# Patient Record
Sex: Male | Born: 1975 | Race: White | Hispanic: No | Marital: Married | Smoking: Never smoker
Health system: Southern US, Community
[De-identification: ages and names within clinical notes are randomized; demographics above are authoritative.]

## PROBLEM LIST (undated history)

## (undated) DIAGNOSIS — E7800 Pure hypercholesterolemia, unspecified: Secondary | ICD-10-CM

## (undated) HISTORY — PX: EAR MEATOPLASTY WITH FULL THICKNESS SKIN GRAFT: SHX6486

---

## 2004-11-23 HISTORY — PX: TYMPANOPLASTY: SHX33

## 2014-11-23 HISTORY — PX: VASECTOMY: SHX75

## 2015-08-01 ENCOUNTER — Telehealth: Payer: Self-pay | Admitting: Adult Health

## 2015-08-01 NOTE — Telephone Encounter (Signed)
Pt's wife, Brendan Gadson,  has est w/ you, and pt is her husband.  He would like to est w/ you as well, however pt has a poss sinus inf and would like to be seen asap.   Typically we can set a new acute appt, and at the same time schedule his new pt appt at a later date. But I wanted to check w/ you to make sure that was ok.  pls advise if ok to schedule, or not. Thank you.

## 2015-08-01 NOTE — Telephone Encounter (Signed)
2 apps for pt has been scheduled.

## 2015-08-01 NOTE — Telephone Encounter (Signed)
That is ok with me.  Thanks,   [employer]

## 2015-08-02 ENCOUNTER — Ambulatory Visit (INDEPENDENT_AMBULATORY_CARE_PROVIDER_SITE_OTHER): Payer: BLUE CROSS/BLUE SHIELD | Admitting: Adult Health

## 2015-08-02 ENCOUNTER — Encounter: Payer: Self-pay | Admitting: Adult Health

## 2015-08-02 VITALS — BP 90/68 | HR 77 | Temp 98.2°F | Ht 69.5 in | Wt 194.3 lb

## 2015-08-02 DIAGNOSIS — J028 Acute pharyngitis due to other specified organisms: Secondary | ICD-10-CM

## 2015-08-02 DIAGNOSIS — J029 Acute pharyngitis, unspecified: Secondary | ICD-10-CM | POA: Diagnosis not present

## 2015-08-02 DIAGNOSIS — B9789 Other viral agents as the cause of diseases classified elsewhere: Secondary | ICD-10-CM

## 2015-08-02 DIAGNOSIS — J0110 Acute frontal sinusitis, unspecified: Secondary | ICD-10-CM | POA: Diagnosis not present

## 2015-08-02 LAB — POCT MONO (EPSTEIN BARR VIRUS): Mono, POC: NEGATIVE

## 2015-08-02 MED ORDER — MAGIC MOUTHWASH W/LIDOCAINE: 5.0000 mL | Freq: Three times a day (TID) | ORAL | Status: DC | PRN

## 2015-08-02 NOTE — Progress Notes (Signed)
Subjective:    Patient ID: Richard Mathews, male    DOB: 04-Jan-1976, 39 y.o.   MRN: [id number]  HPI  39 year old male who presents to the office today for sinusitis type symptoms. His symptoms have been going on for two days, and include sinus pain and pressure, headache, body aches, chills, and generalized fatigue with yellow nasal drainage.   Denies fevers, nausea, vomiting, or diarrhea.   Has been using OTC sinus medication, which helped with the symptoms.  Review of Systems  Constitutional: Positive for activity change and fatigue. Negative for fever, chills, diaphoresis and appetite change.  HENT: Positive for congestion, ear pain (pressure), postnasal drip, rhinorrhea, sinus pressure and sore throat. Negative for tinnitus and trouble swallowing.   Eyes: Negative.   Respiratory: Negative.   Cardiovascular: Negative.   Skin: Negative.   Neurological: Positive for headaches (frontal ). Negative for speech difficulty and light-headedness.  Hematological: Negative.   Psychiatric/Behavioral: Negative.   All other systems reviewed and are negative.  No past medical history on file.  Social History   Social History  . Marital Status: Unknown    Spouse Name: N/A  . Number of Children: N/A  . Years of Education: N/A   Occupational History  . Not on file.   Social History Main Topics  . Smoking status: Never Smoker   . Smokeless tobacco: Not on file  . Alcohol Use: 0.0 oz/week    0 Standard drinks or equivalent per week     Comment: socailly  . Drug Use: Not on file  . Sexual Activity: Not on file   Other Topics Concern  . Not on file   Social History Narrative  . No narrative on file    No past surgical history on file.  No family history on file.  No Known Allergies  No current outpatient prescriptions on file prior to visit.   No current facility-administered medications on file prior to visit.    BP 90/68 mmHg  Pulse 77  Temp(Src) 98.2 F (36.8 C)  (Oral)  Ht 5' 9.5" (1.765 m)  Wt 194 lb 4.8 oz (88.134 kg)  BMI 28.29 kg/m2  SpO2 97%       Objective:   Physical Exam  Constitutional: He is oriented to person, place, and time. He appears well-developed and well-nourished. No distress.  HENT:  Head: Normocephalic and atraumatic.  Right Ear: External ear normal.  Left Ear: External ear normal.  Nose: Nose normal.  Mouth/Throat: Oropharynx is clear and moist. No oropharyngeal exudate.  Sinus pain and pressure to frontal and maxillary sinus  Eyes: Conjunctivae and EOM are normal. Pupils are equal, round, and reactive to light. Right eye exhibits no discharge. Left eye exhibits no discharge. No scleral icterus.  No jaundice noted  Neck: No thyromegaly present.  Cardiovascular: Normal rate, regular rhythm, normal heart sounds and intact distal pulses.  Exam reveals no gallop.   No murmur heard. Pulmonary/Chest: Effort normal and breath sounds normal. No respiratory distress. He has no wheezes. He has no rales. He exhibits no tenderness.  Abdominal: Soft. Bowel sounds are normal. He exhibits no distension and no mass. There is tenderness (slight tenderness over liver with deep palpation). There is no rebound and no guarding.  Lymphadenopathy:    He has no cervical adenopathy.  Neurological: He is alert and oriented to person, place, and time.  Skin: Skin is warm and dry. No rash noted. He is not diaphoretic. No erythema. No pallor.  Psychiatric:  He has a normal mood and affect. His behavior is normal. Judgment and thought content normal.  Nursing note and vitals reviewed.     Assessment & Plan:  1. Acute frontal sinusitis, recurrence not specified - Likely viral  - POC Mono (Epstein Barr Virus)0- Negative - Flonase and Claritin for sinus pain and pressure - Rest and hydration - Ibuprofen for body aches - Follow up in 2-3 days if no improvement.  2. Sore throat (viral) - magic mouthwash w/lidocaine SOLN; Take 5 mLs by mouth 3  (three) times daily as needed for mouth pain.  Dispense: 50 mL; Refill: 0

## 2015-08-02 NOTE — Patient Instructions (Addendum)
It was great seeing you again!  It sounds like you have a viral sinusitis.   Try using claritin and flonase to help with the sinus pressure. Use the magic mouth wash for throat pain ( gargle and spit). Ibuprofen for body aches. Stay hydrated and get plenty of rest.   If your symptoms do not get any better in 2-3 days,please let me know.   Upper Respiratory Infection, Adult An upper respiratory infection (URI) is also sometimes known as the common cold. The upper respiratory tract includes the nose, sinuses, throat, trachea, and bronchi. Bronchi are the airways leading to the lungs. Most people improve within 1 week, but symptoms can last up to 2 weeks. A residual cough may last even longer.  CAUSES Many different viruses can infect the tissues lining the upper respiratory tract. The tissues become irritated and inflamed and often become very moist. Mucus production is also common. A cold is contagious. You can easily spread the virus to others by oral contact. This includes kissing, sharing a glass, coughing, or sneezing. Touching your mouth or nose and then touching a surface, which is then touched by another person, can also spread the virus. SYMPTOMS  Symptoms typically develop 1 to 3 days after you come in contact with a cold virus. Symptoms vary from person to person. They may include:  Runny nose.  Sneezing.  Nasal congestion.  Sinus irritation.  Sore throat.  Loss of voice (laryngitis).  Cough.  Fatigue.  Muscle aches.  Loss of appetite.  Headache.  Low-grade fever. DIAGNOSIS  You might diagnose your own cold based on familiar symptoms, since most people get a cold 2 to 3 times a year. Your caregiver can confirm this based on your exam. Most importantly, your caregiver can check that your symptoms are not due to another disease such as strep throat, sinusitis, pneumonia, asthma, or epiglottitis. Blood tests, throat tests, and X-rays are not necessary to diagnose a common  cold, but they may sometimes be helpful in excluding other more serious diseases. Your caregiver will decide if any further tests are required. RISKS AND COMPLICATIONS  You may be at risk for a more severe case of the common cold if you smoke cigarettes, have chronic heart disease (such as heart failure) or lung disease (such as asthma), or if you have a weakened immune system. The very young and very old are also at risk for more serious infections. Bacterial sinusitis, middle ear infections, and bacterial pneumonia can complicate the common cold. The common cold can worsen asthma and chronic obstructive pulmonary disease (COPD). Sometimes, these complications can require emergency medical care and may be life-threatening. PREVENTION  The best way to protect against getting a cold is to practice good hygiene. Avoid oral or hand contact with people with cold symptoms. Wash your hands often if contact occurs. There is no clear evidence that vitamin C, vitamin E, echinacea, or exercise reduces the chance of developing a cold. However, it is always recommended to get plenty of rest and practice good nutrition. TREATMENT  Treatment is directed at relieving symptoms. There is no cure. Antibiotics are not effective, because the infection is caused by a virus, not by bacteria. Treatment may include:  Increased fluid intake. Sports drinks offer valuable electrolytes, sugars, and fluids.  Breathing heated mist or steam (vaporizer or shower).  Eating chicken soup or other clear broths, and maintaining good nutrition.  Getting plenty of rest.  Using gargles or lozenges for comfort.  Controlling fevers with  ibuprofen or acetaminophen as directed by your caregiver.  Increasing usage of your inhaler if you have asthma. Zinc gel and zinc lozenges, taken in the first 24 hours of the common cold, can shorten the duration and lessen the severity of symptoms. Pain medicines may help with fever, muscle aches, and  throat pain. A variety of non-prescription medicines are available to treat congestion and runny nose. Your caregiver can make recommendations and may suggest nasal or lung inhalers for other symptoms.  HOME CARE INSTRUCTIONS   Only take over-the-counter or prescription medicines for pain, discomfort, or fever as directed by your caregiver.  Use a warm mist humidifier or inhale steam from a shower to increase air moisture. This may keep secretions moist and make it easier to breathe.  Drink enough water and fluids to keep your urine clear or pale yellow.  Rest as needed.  Return to work when your temperature has returned to normal or as your caregiver advises. You may need to stay home longer to avoid infecting others. You can also use a face mask and careful hand washing to prevent spread of the virus. SEEK MEDICAL CARE IF:   After the first few days, you feel you are getting worse rather than better.  You need your caregiver's advice about medicines to control symptoms.  You develop chills, worsening shortness of breath, or brown or red sputum. These may be signs of pneumonia.  You develop yellow or brown nasal discharge or pain in the face, especially when you bend forward. These may be signs of sinusitis.  You develop a fever, swollen neck glands, pain with swallowing, or white areas in the back of your throat. These may be signs of strep throat. SEEK IMMEDIATE MEDICAL CARE IF:   You have a fever.  You develop severe or persistent headache, ear pain, sinus pain, or chest pain.  You develop wheezing, a prolonged cough, cough up blood, or have a change in your usual mucus (if you have chronic lung disease).  You develop sore muscles or a stiff neck. Document Released: 05/05/2001 Document Revised: 02/01/2012 Document Reviewed: 02/14/2014 [facility] Patient Information 2015 [place], [place]. This information is not intended to replace advice given to you by your health care provider.  Make sure you discuss any questions you have with your health care provider.

## 2015-08-02 NOTE — Progress Notes (Signed)
Pre visit review using our clinic review tool, if applicable. No additional management support is needed unless otherwise documented below in the visit note. 

## 2015-09-25 ENCOUNTER — Ambulatory Visit (INDEPENDENT_AMBULATORY_CARE_PROVIDER_SITE_OTHER): Payer: BLUE CROSS/BLUE SHIELD | Admitting: Adult Health

## 2015-09-25 ENCOUNTER — Encounter: Payer: Self-pay | Admitting: Adult Health

## 2015-09-25 VITALS — BP 100/60 | Temp 98.5°F | Ht 69.5 in | Wt 191.4 lb

## 2015-09-25 DIAGNOSIS — Z0000 Encounter for general adult medical examination without abnormal findings: Secondary | ICD-10-CM

## 2015-09-25 MED ORDER — PROPRANOLOL HCL 40 MG PO TABS: 40.0000 mg | ORAL_TABLET | Freq: Two times a day (BID) | ORAL | Status: DC

## 2015-09-25 NOTE — Progress Notes (Signed)
Pre visit review using our clinic review tool, if applicable. No additional management support is needed unless otherwise documented below in the visit note. 

## 2015-09-25 NOTE — Patient Instructions (Addendum)
It was great seeing you again  Continue to eat healthy and exercise.   Come back for your labs and then I will follow up with you regarding the results.   I will see you in a year for your next physical or sooner if needed.   Please let me know if you need anything.     Health Maintenance, Male A healthy lifestyle and preventative care can promote health and wellness.  Maintain regular health, dental, and eye exams.  Eat a healthy diet. Foods like vegetables, fruits, whole grains, low-fat dairy products, and lean protein foods contain the nutrients you need and are low in calories. Decrease your intake of foods high in solid fats, added sugars, and salt. Get information about a proper diet from your health care provider, if necessary.  Regular physical exercise is one of the most important things you can do for your health. Most adults should get at least 150 minutes of moderate-intensity exercise (any activity that increases your heart rate and causes you to sweat) each week. In addition, most adults need muscle-strengthening exercises on 2 or more days a week.   Maintain a healthy weight. The body mass index (BMI) is a screening tool to identify possible weight problems. It provides an estimate of body fat based on height and weight. Your health care provider can find your BMI and can help you achieve or maintain a healthy weight. For males 20 years and older:  A BMI below 18.5 is considered underweight.  A BMI of 18.5 to 24.9 is normal.  A BMI of 25 to 29.9 is considered overweight.  A BMI of 30 and above is considered obese.  Maintain normal blood lipids and cholesterol by exercising and minimizing your intake of saturated fat. Eat a balanced diet with plenty of fruits and vegetables. Blood tests for lipids and cholesterol should begin at age 37 and be repeated every 5 years. If your lipid or cholesterol levels are high, you are over age 37, or you are at high risk for heart  disease, you may need your cholesterol levels checked more frequently.Ongoing high lipid and cholesterol levels should be treated with medicines if diet and exercise are not working.  If you smoke, find out from your health care provider how to quit. If you do not use tobacco, do not start.  Lung cancer screening is recommended for adults aged 55-80 years who are at high risk for developing lung cancer because of a history of smoking. A yearly low-dose CT scan of the lungs is recommended for people who have at least a 30-pack-year history of smoking and are current smokers or have quit within the past 15 years. A pack year of smoking is smoking an average of 1 pack of cigarettes a day for 1 year (for example, a 30-pack-year history of smoking could mean smoking 1 pack a day for 30 years or 2 packs a day for 15 years). Yearly screening should continue until the smoker has stopped smoking for at least 15 years. Yearly screening should be stopped for people who develop a health problem that would prevent them from having lung cancer treatment.  If you choose to drink alcohol, do not have more than 2 drinks per day. One drink is considered to be 12 oz (360 mL) of beer, 5 oz (150 mL) of wine, or 1.5 oz (45 mL) of liquor.  Avoid the use of street drugs. Do not share needles with anyone. Ask for help if you  need support or instructions about stopping the use of drugs.  High blood pressure causes heart disease and increases the risk of stroke. High blood pressure is more likely to develop in:  People who have blood pressure in the end of the normal range (100-139/85-89 mm Hg).  People who are overweight or obese.  People who are African American.  If you are 48-65 years of age, have your blood pressure checked every 3-5 years. If you are 61 years of age or older, have your blood pressure checked every year. You should have your blood pressure measured twice--once when you are at a hospital or clinic, and  once when you are not at a hospital or clinic. Record the average of the two measurements. To check your blood pressure when you are not at a hospital or clinic, you can use:  An automated blood pressure machine at a pharmacy.  A home blood pressure monitor.  If you are 14-54 years old, ask your health care provider if you should take aspirin to prevent heart disease.  Diabetes screening involves taking a blood sample to check your fasting blood sugar level. This should be done once every 3 years after age 34 if you are at a normal weight and without risk factors for diabetes. Testing should be considered at a younger age or be carried out more frequently if you are overweight and have at least 1 risk factor for diabetes.  Colorectal cancer can be detected and often prevented. Most routine colorectal cancer screening begins at the age of 15 and continues through age 47. However, your health care provider may recommend screening at an earlier age if you have risk factors for colon cancer. On a yearly basis, your health care provider may provide home test kits to check for hidden blood in the stool. A small camera at the end of a tube may be used to directly examine the colon (sigmoidoscopy or colonoscopy) to detect the earliest forms of colorectal cancer. Talk to your health care provider about this at age 13 when routine screening begins. A direct exam of the colon should be repeated every 5-10 years through age 4, unless early forms of precancerous polyps or small growths are found.  People who are at an increased risk for hepatitis B should be screened for this virus. You are considered at high risk for hepatitis B if:  You were born in a country where hepatitis B occurs often. Talk with your health care provider about which countries are considered high risk.  Your parents were born in a high-risk country and you have not received a shot to protect against hepatitis B (hepatitis B  vaccine).  You have HIV or AIDS.  You use needles to inject street drugs.  You live with, or have sex with, someone who has hepatitis B.  You are a man who has sex with other men (MSM).  You get hemodialysis treatment.  You take certain medicines for conditions like cancer, organ transplantation, and autoimmune conditions.  Hepatitis C blood testing is recommended for all people born from [birth year] through 1963 and any individual with known risk factors for hepatitis C.  Healthy men should no longer receive prostate-specific antigen (PSA) blood tests as part of routine cancer screening. Talk to your health care provider about prostate cancer screening.  Testicular cancer screening is not recommended for adolescents or adult males who have no symptoms. Screening includes self-exam, a health care provider exam, and other screening tests. Consult with  your health care provider about any symptoms you have or any concerns you have about testicular cancer.  Practice safe sex. Use condoms and avoid high-risk sexual practices to reduce the spread of sexually transmitted infections (STIs).  You should be screened for STIs, including gonorrhea and chlamydia if:  You are sexually active and are younger than 24 years.  You are older than 24 years, and your health care provider tells you that you are at risk for this type of infection.  Your sexual activity has changed since you were last screened, and you are at an increased risk for chlamydia or gonorrhea. Ask your health care provider if you are at risk.  If you are at risk of being infected with HIV, it is recommended that you take a prescription medicine daily to prevent HIV infection. This is called pre-exposure prophylaxis (PrEP). You are considered at risk if:  You are a man who has sex with other men (MSM).  You are a heterosexual man who is sexually active with multiple partners.  You take drugs by injection.  You are sexually active  with a partner who has HIV.  Talk with your health care provider about whether you are at high risk of being infected with HIV. If you choose to begin PrEP, you should first be tested for HIV. You should then be tested every 3 months for as long as you are taking PrEP.  Use sunscreen. Apply sunscreen liberally and repeatedly throughout the day. You should seek shade when your shadow is shorter than you. Protect yourself by wearing long sleeves, pants, a wide-brimmed hat, and sunglasses year round whenever you are outdoors.  Tell your health care provider of new moles or changes in moles, especially if there is a change in shape or color. Also, tell your health care provider if a mole is larger than the size of a pencil eraser.  A one-time screening for abdominal aortic aneurysm (AAA) and surgical repair of large AAAs by ultrasound is recommended for men aged 71-75 years who are current or former smokers.  Stay current with your vaccines (immunizations).   This information is not intended to replace advice given to you by your health care provider. Make sure you discuss any questions you have with your health care provider.   Document Released: 05/07/2008 Document Revised: 11/30/2014 Document Reviewed: 04/06/2011 Elsevier Interactive Patient Education [employer].

## 2015-09-25 NOTE — Progress Notes (Signed)
HPI:  Richard DownsJoshua Mathews is here to establish care and have his complete physical. He is a healthy caucasian male with no past medical history. His most recent surgery was a vasectomy 8 months ago.    Last PCP and physical: 10 years ago  Immunizations: UTD Diet: He is eating healthier Exercise:Getting back into running. Swim as a family.   Has the following chronic problems that require follow up and concerns today:  He has no chronic issues and no concerns he wants to talk about today.   ROS negative for unless reported above: fevers, chills,feeling poorly, unintentional weight loss, hearing or vision loss, chest pain, palpitations, leg claudication, struggling to breath,Not feeling congested in the chest, no orthopenia, no cough,no wheezing, normal appetite, no soft tissue swelling, no hemoptysis, melena, hematochezia, hematuria, falls, loc, si, or thoughts of self harm.    History reviewed. No pertinent past medical history.  Past Surgical History  Procedure Laterality Date  . Tympanoplasty  2006  . Ear meatoplasty with full thickness skin graft    . Vasectomy  2016    History reviewed. No pertinent family history.  Social History   Social History  . Marital Status: Unknown    Spouse Name: N/A  . Number of Children: N/A  . Years of Education: N/A   Social History Main Topics  . Smoking status: Never Smoker   . Smokeless tobacco: None  . Alcohol Use: 0.0 oz/week    0 Standard drinks or equivalent per week     Comment: socailly  . Drug Use: None  . Sexual Activity: Not Asked   Other Topics Concern  . None   Social History Narrative     Current outpatient prescriptions:  Marland Kitchen.  Multiple Vitamin (MULTIVITAMIN) tablet, Take 1 tablet by mouth daily., Disp: , Rfl:   EXAM:  Filed Vitals:   09/25/15 0949  BP: 100/60  Temp: 98.5 F (36.9 C)    Body mass index is 27.87 kg/(m^2).  GENERAL: vitals reviewed and listed above, alert, oriented, appears well hydrated  and in no acute distress  HEENT: atraumatic, conjunttiva clear, no obvious abnormalities on inspection of external nose and ears  NECK: Neck is soft and supple without masses, no adenopathy or thyromegaly, trachea midline, no JVD. Normal range of motion.   LUNGS: clear to auscultation bilaterally, no wheezes, rales or rhonchi, good air movement  CV: Regular rate and rhythm, normal S1/S2, no audible murmurs, gallops, or rubs. No peripheral edema.   MS: moves all extremities without noticeable abnormality. No edema noted  Abd: soft/nontender/nondistended/normal bowel sounds   Skin: warm and dry, no rash   Extremities: No clubbing, cyanosis, or edema. Capillary refill is WNL. Pulses intact bilaterally in upper and lower extremities.   Neuro: CN II-XII intact, sensation and reflexes normal throughout, 5/5 muscle strength in bilateral upper and lower extremities. Normal finger to nose. Normal rapid alternating movements.    PSYCH: pleasant and cooperative, no obvious depression or anxiety  ASSESSMENT AND PLAN:  1. Routine general medical examination at a health care facility - Benign exam  - Continue to eat healthy and exercise - Follow up as needed - Hemoglobin A1c; Future - Hepatic function panel; Future - POCT urinalysis dipstick; Future - Lipid panel; Future - Basic metabolic panel; Future - TSH; Future - CBC with Differential/Platelet; Future  -We reviewed the PMH, PSH, FH, SH, Meds and Allergies. -We provided refills for any medications we will prescribe as needed. -We addressed current concerns per orders  and patient instructions. -We have asked for records for pertinent exams, studies, vaccines and notes from previous providers. -We have advised patient to follow up per instructions below.   -Patient advised to return or notify a provider immediately if symptoms worsen or persist or new concerns arise.   Shirline Frees, AGNP

## 2015-10-11 ENCOUNTER — Encounter: Payer: Self-pay | Admitting: Adult Health

## 2015-10-11 ENCOUNTER — Ambulatory Visit (INDEPENDENT_AMBULATORY_CARE_PROVIDER_SITE_OTHER): Payer: BLUE CROSS/BLUE SHIELD | Admitting: Adult Health

## 2015-10-11 ENCOUNTER — Other Ambulatory Visit: Payer: Self-pay | Admitting: Adult Health

## 2015-10-11 ENCOUNTER — Other Ambulatory Visit: Payer: Self-pay

## 2015-10-11 ENCOUNTER — Ambulatory Visit (INDEPENDENT_AMBULATORY_CARE_PROVIDER_SITE_OTHER)
Admission: RE | Admit: 2015-10-11 | Discharge: 2015-10-11 | Disposition: A | Discharge status: Home / Self Care | Payer: BLUE CROSS/BLUE SHIELD | Source: Ambulatory Visit | Attending: Adult Health | Admitting: Adult Health

## 2015-10-11 ENCOUNTER — Telehealth: Payer: Self-pay | Admitting: Adult Health

## 2015-10-11 VITALS — BP 120/80 | Temp 98.9°F | Ht 69.5 in | Wt 192.8 lb

## 2015-10-11 DIAGNOSIS — R1032 Left lower quadrant pain: Secondary | ICD-10-CM | POA: Diagnosis not present

## 2015-10-11 DIAGNOSIS — Z0000 Encounter for general adult medical examination without abnormal findings: Secondary | ICD-10-CM

## 2015-10-11 LAB — BASIC METABOLIC PANEL
BUN: 16 mg/dL (ref 6–23)
CO2: 29 meq/L (ref 19–32)
Calcium: 9.4 mg/dL (ref 8.4–10.5)
Chloride: 104 mEq/L (ref 96–112)
Creatinine, Ser: 1.11 mg/dL (ref 0.40–1.50)
GFR: 78.91 mL/min (ref >60.00)
GLUCOSE: 93 mg/dL (ref 70–99)
POTASSIUM: 4.7 meq/L (ref 3.5–5.1)
SODIUM: 141 meq/L (ref 135–145)

## 2015-10-11 LAB — CBC WITH DIFFERENTIAL/PLATELET
BASOS ABS: 0 10*3/uL (ref 0.0–0.1)
BASOS PCT: 0.6 % (ref 0.0–3.0)
EOS PCT: 3.2 % (ref 0.0–5.0)
Eosinophils Absolute: 0.2 10*3/uL (ref 0.0–0.7)
HCT: 46.1 % (ref 39.0–52.0)
Hemoglobin: 15.5 g/dL (ref 13.0–17.0)
LYMPHS ABS: 1.8 10*3/uL (ref 0.7–4.0)
Lymphocytes Relative: 32.3 % (ref 12.0–46.0)
MCHC: 33.7 g/dL (ref 30.0–36.0)
MCV: 95.3 fl (ref 78.0–100.0)
MONOS PCT: 9.4 % (ref 3.0–12.0)
Monocytes Absolute: 0.5 10*3/uL (ref 0.1–1.0)
NEUTROS ABS: 3.1 10*3/uL (ref 1.4–7.7)
NEUTROS PCT: 54.5 % (ref 43.0–77.0)
PLATELETS: 156 10*3/uL (ref 150.0–400.0)
RBC: 4.83 Mil/uL (ref 4.22–5.81)
RDW: 13 % (ref 11.5–15.5)
WBC: 5.6 10*3/uL (ref 4.0–10.5)

## 2015-10-11 LAB — HEPATIC FUNCTION PANEL
ALK PHOS: 52 U/L (ref 39–117)
ALT: 19 U/L (ref 0–53)
AST: 17 U/L (ref 0–37)
Albumin: 4.5 g/dL (ref 3.5–5.2)
BILIRUBIN DIRECT: 0.1 mg/dL (ref 0.0–0.3)
BILIRUBIN TOTAL: 0.7 mg/dL (ref 0.2–1.2)
Total Protein: 7.1 g/dL (ref 6.0–8.3)

## 2015-10-11 LAB — POCT URINALYSIS DIPSTICK
BILIRUBIN UA: NEGATIVE
Blood, UA: NEGATIVE
GLUCOSE UA: NEGATIVE
Ketones, UA: NEGATIVE
LEUKOCYTES UA: NEGATIVE
NITRITE UA: NEGATIVE
PH UA: 7
Protein, UA: NEGATIVE
Spec Grav, UA: 1.02
Urobilinogen, UA: 0.2

## 2015-10-11 LAB — LIPID PANEL
CHOL/HDL RATIO: 5
Cholesterol: 201 mg/dL — ABNORMAL HIGH (ref 0–200)
HDL: 43.4 mg/dL (ref >39.00)
LDL Cholesterol: 135 mg/dL — ABNORMAL HIGH (ref 0–99)
NONHDL: 157.35
Triglycerides: 111 mg/dL (ref 0.0–149.0)
VLDL: 22.2 mg/dL (ref 0.0–40.0)

## 2015-10-11 LAB — HEMOGLOBIN A1C: HEMOGLOBIN A1C: 5.1 % (ref 4.6–6.5)

## 2015-10-11 LAB — TSH: TSH: 3.21 u[IU]/mL (ref 0.35–4.50)

## 2015-10-11 LAB — LIPASE: LIPASE: 22 U/L (ref 11.0–59.0)

## 2015-10-11 MED ORDER — OXYCODONE-ACETAMINOPHEN 5-325 MG PO TABS: 1.0000 | ORAL_TABLET | Freq: Three times a day (TID) | ORAL | Status: DC | PRN

## 2015-10-11 MED ORDER — CYCLOBENZAPRINE HCL 10 MG PO TABS: 10.0000 mg | ORAL_TABLET | Freq: Three times a day (TID) | ORAL | Status: DC | PRN

## 2015-10-11 MED ORDER — IOHEXOL 300 MG/ML  SOLN
100.0000 mL | Freq: Once | INTRAMUSCULAR | Status: AC | PRN
  Administered 2015-10-11: 100 mL via INTRAVENOUS

## 2015-10-11 NOTE — Addendum Note (Signed)
Addended by: Baldwin CrownJOHNSON, SHAQUETTA D on: 10/11/2015 08:52 AM   Modules accepted: Orders

## 2015-10-11 NOTE — Patient Instructions (Addendum)
It was great seeing you today! I am sorry you are going through this pain.   I will follow up with you regarding your blood work and CT scan.   Flank Pain Flank pain refers to pain that is located on the side of the body between the upper abdomen and the back. The pain may occur over a short period of time (acute) or may be long-term or reoccurring (chronic). It may be mild or severe. Flank pain can be caused by many things. CAUSES  Some of the more common causes of flank pain include:  Muscle strains.   Muscle spasms.   A disease of your spine (vertebral disk disease).   A lung infection (pneumonia).   Fluid around your lungs (pulmonary edema).   A kidney infection.   Kidney stones.   A very painful skin rash caused by the chickenpox virus (shingles).   Gallbladder disease.  HOME CARE INSTRUCTIONS  Home care will depend on the cause of your pain. In general,  Rest as directed by your caregiver.  Drink enough fluids to keep your urine clear or pale yellow.  Only take over-the-counter or prescription medicines as directed by your caregiver. Some medicines may help relieve the pain.  Tell your caregiver about any changes in your pain.  Follow up with your caregiver as directed. SEEK IMMEDIATE MEDICAL CARE IF:   Your pain is not controlled with medicine.   You have new or worsening symptoms.  Your pain increases.   You have abdominal pain.   You have shortness of breath.   You have persistent nausea or vomiting.   You have swelling in your abdomen.   You feel faint or pass out.   You have blood in your urine.  You have a fever or persistent symptoms for more than 2-3 days.  You have a fever and your symptoms suddenly get worse. MAKE SURE YOU:   Understand these instructions.  Will watch your condition.  Will get help right away if you are not doing well or get worse.   This information is not intended to replace advice given to you by  your health care provider. Make sure you discuss any questions you have with your health care provider.   Document Released: 12/31/2005 Document Revised: 08/03/2012 Document Reviewed: 06/23/2012 Elsevier Interactive Patient Education [employer]2016 Elsevier Inc.

## 2015-10-11 NOTE — Telephone Encounter (Signed)
[name] on the phone today after his CAT scan. Informed him that everything on the CAT scan came back negative. Also informed of his blood work which was negative as well. Together we decided to wait the weekend see if this was more of a muscle skeletal type of issue going on, he is in a use Flexeril as needed as well as ibuprofen 600 mg every 8 hours. I did send him in prescription for Percocet 03/25/2024 to take for breakthrough pain. He is to follow-up with me on Monday, at that time we'll decide if we need do a renal ultrasound.

## 2015-10-11 NOTE — Addendum Note (Signed)
Addended by: JOHNSON, SHAQUETTA D on: 10/11/2015 08:52 AM   Modules accepted: Orders  

## 2015-10-11 NOTE — Progress Notes (Signed)
Subjective:    Patient ID: Richard Mathews, male    DOB: [birth year], 39 y.o.   MRN: [id number]  HPI  39 year old male who presents to the office today for left flank pain. The timing of the pain is not known, but over the last week the pain has been becoming worse. The pain is described as a " constant dull pain". Pain with sitting but the pain is worse with movements - especially getting up from sitting position and twisting.. The only time he doesn't have pain is when he is sleeping on his right side.   His last BM was last night and he had no pain with it. Also denies any blood in urine.   No nausea, vomting, diarrhea, fevers.   Has not tried anything over the counter   He denies any trauma to the area.   Review of Systems  Constitutional: Positive for activity change and appetite change. Negative for fever and fatigue.  Respiratory: Negative.   Cardiovascular: Negative.   Gastrointestinal: Positive for nausea and abdominal pain. Negative for vomiting, diarrhea, constipation, blood in stool, abdominal distention and rectal pain.  Genitourinary: Negative.   Psychiatric/Behavioral: Positive for sleep disturbance.  All other systems reviewed and are negative.  No past medical history on file.  Social History   Social History  . Marital Status: Unknown    Spouse Name: [name]  . Number of Children: N/A  . Years of Education: N/A   Occupational History  . Not on file.   Social History Main Topics  . Smoking status: Never Smoker   . Smokeless tobacco: Not on file  . Alcohol Use: 0.0 oz/week    0 Standard drinks or equivalent per week     Comment: socailly  . Drug Use: No  . Sexual Activity: Not on file   Other Topics Concern  . Not on file   Social History Narrative   Wife [name]) is a patient with MS    Past Surgical History  Procedure Laterality Date  . Tympanoplasty  2006  . Ear meatoplasty with full thickness skin graft    . Vasectomy  2016    Family History    Problem Relation Age of Onset  . Heart disease Maternal Grandfather   . Heart attack Paternal Grandfather     No Known Allergies  Current Outpatient Prescriptions on File Prior to Visit  Medication Sig Dispense Refill  . Multiple Vitamin (MULTIVITAMIN) tablet Take 1 tablet by mouth daily.     No current facility-administered medications on file prior to visit.    BP 120/80 mmHg  Temp(Src) 98.9 F (37.2 C) (Oral)  Ht 5' 9.5" (1.765 m)  Wt 192 lb 12.8 oz (87.454 kg)  BMI 28.07 kg/m2       Objective:   Physical Exam  Constitutional: He is oriented to person, place, and time. He appears well-developed and well-nourished. No distress.  Cardiovascular: Normal rate, regular rhythm, normal heart sounds and intact distal pulses.  Exam reveals no gallop and no friction rub.   No murmur heard. Pulmonary/Chest: Effort normal and breath sounds normal. No respiratory distress. He has no wheezes. He has no rales. He exhibits no tenderness.  Abdominal: Soft. Bowel sounds are normal. He exhibits no distension and no mass. There is tenderness. There is guarding. There is no rebound.  Umbilical hernia   Severe abdominal pain with palpation to left upper quadrant.   + CVA tenderness  Musculoskeletal: Normal range of motion. He exhibits  no edema or tenderness.  Neurological: He is alert and oriented to person, place, and time.  Skin: Skin is warm and dry. No rash noted. He is not diaphoretic. No erythema. No pallor.  Psychiatric: He has a normal mood and affect. His behavior is normal. Judgment and thought content normal.  Vitals reviewed.      Assessment & Plan:  1. Abdominal pain, left lower quadrant - Released future orders - Lipase - CT Abdomen Pelvis W Contrast; Future - Like;y kidney stone but cannot r/o pancreatitis, diverticulitis. Less likely IBS, inguinal hernia, gastric ulcer

## 2015-10-14 ENCOUNTER — Telehealth: Payer: Self-pay | Admitting: Adult Health

## 2015-10-14 ENCOUNTER — Other Ambulatory Visit: Payer: Self-pay | Admitting: Adult Health

## 2015-10-14 DIAGNOSIS — R109 Unspecified abdominal pain: Secondary | ICD-10-CM

## 2015-10-14 NOTE — Telephone Encounter (Signed)
Spoke to patient on the phone. He used Flexeril and Percocet over the weekend. Despite slight relief when he uses these medication, his discomfort has not improved over all.   Will get Renal US as well as US of abdomen.

## 2015-10-15 ENCOUNTER — Other Ambulatory Visit: Payer: Self-pay | Admitting: Adult Health

## 2015-10-15 ENCOUNTER — Ambulatory Visit
Admission: RE | Admit: 2015-10-15 | Discharge: 2015-10-15 | Disposition: A | Discharge status: Home / Self Care | Payer: BLUE CROSS/BLUE SHIELD | Source: Ambulatory Visit | Attending: Adult Health | Admitting: Adult Health

## 2015-10-15 ENCOUNTER — Telehealth: Payer: Self-pay | Admitting: Adult Health

## 2015-10-15 ENCOUNTER — Other Ambulatory Visit: Payer: BLUE CROSS/BLUE SHIELD

## 2015-10-15 DIAGNOSIS — R109 Unspecified abdominal pain: Secondary | ICD-10-CM

## 2015-10-15 MED ORDER — TAMSULOSIN HCL 0.4 MG PO CAPS: 0.4000 mg | ORAL_CAPSULE | Freq: Every day | ORAL | Status: DC

## 2015-10-15 NOTE — Telephone Encounter (Signed)
Updated patient with results of ultrasound. He has a 5mm nonobstructing stone. Will call in flomax 0.4 mg to be taken daily. Encouraged to drink a lot of fluid

## 2015-10-21 ENCOUNTER — Other Ambulatory Visit: Payer: BLUE CROSS/BLUE SHIELD

## 2015-10-21 ENCOUNTER — Telehealth: Payer: Self-pay | Admitting: Adult Health

## 2015-10-21 NOTE — Telephone Encounter (Signed)
Pt call to say that he did not pass the kidney stone and is asking what is the next step .

## 2015-10-21 NOTE — Telephone Encounter (Signed)
I do not think he will need a procedure at all as the stone is small enough that it should pass without difficulty. The stone is more than likely still in the kidney and has not made it's way to the ureter yet. Continue forcing fluids and taking Flomax. Let me know if he has sharp pains.

## 2015-10-21 NOTE — Telephone Encounter (Signed)
Continue to take the flomax and drink plenty of fluid. I was hoping that it would pass by now but can take up to two weeks. We can send him to Urology if needed. Is he in a lot of pain?

## 2015-10-21 NOTE — Telephone Encounter (Signed)
Called and spoke with pt and pt states he does not want to rush things more than needed especially if [facility]enCory feels like time is needed. Insurance wise the new year starts on Thursday Oct 24, 2015 and if there is any chance pt may need to go in for a procedure- why not consider doing it before the new year. Pt states he is not in any more pain than when he was seen.  Pt feels sore if he moves his torso or he sleeps on that side. At times pt states he almost forgets about it.  Pls advise.

## 2015-10-21 NOTE — Telephone Encounter (Signed)
Left a message for pt to return call 

## 2015-10-22 MED ORDER — TAMSULOSIN HCL 0.4 MG PO CAPS: 0.4000 mg | ORAL_CAPSULE | Freq: Every day | ORAL | Status: DC

## 2015-10-22 MED ORDER — OXYCODONE-ACETAMINOPHEN 5-325 MG PO TABS: 1.0000 | ORAL_TABLET | Freq: Two times a day (BID) | ORAL | Status: DC | PRN

## 2015-10-22 NOTE — Telephone Encounter (Signed)
Called and spoke with pt and pt is aware. [facility] for [facility] to have a refill of oxycodone and flomax.  Pt will come pick up the prescription.

## 2016-03-27 IMAGING — CT CT ABD-PEL WO/W CM
2 of 7 series · 14 of 46 positions shown, 19 images · IV contrast (omnipaque)
Comparison: None.

CLINICAL DATA: Left flank pain.  Left lower quadrant pain.

EXAM:
CT ABDOMEN AND PELVIS WITHOUT AND WITH CONTRAST
TECHNIQUE: Multidetector CT imaging of the abdomen and pelvis was performed
following the standard protocol before and following the bolus
administration of intravenous contrast.
CONTRAST:  100mL OMNIPAQUE IOHEXOL 300 MG/ML  SOLN

[Series 3: ap post · axial · 0.68mm/px · z∈[-502,-56]mm · 11 of 101 slices shown, 16 images]
[im 6/101  soft-tissue]
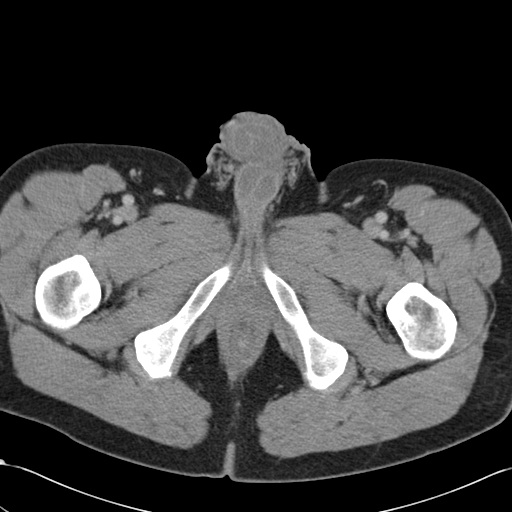
[im 6/101  bone]
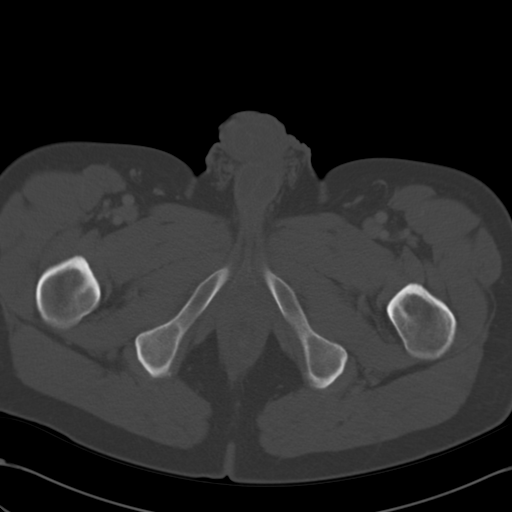
[im 18/101  soft-tissue]
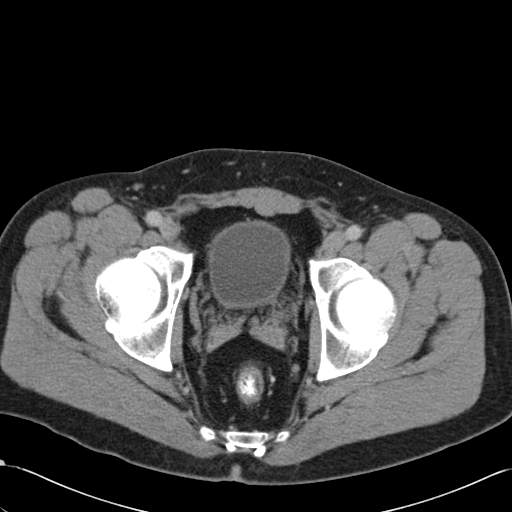
[im 30/101  soft-tissue]
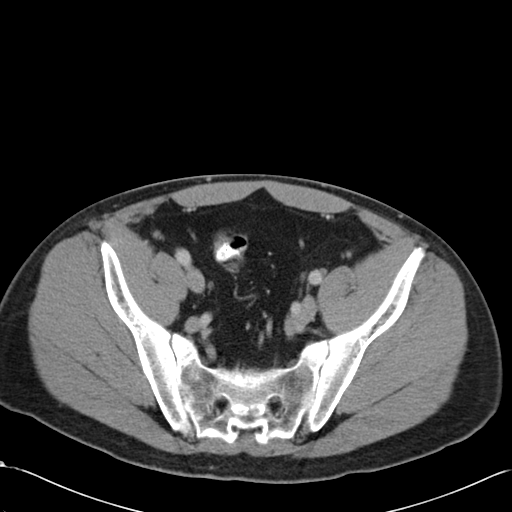
[im 36/101  soft-tissue]
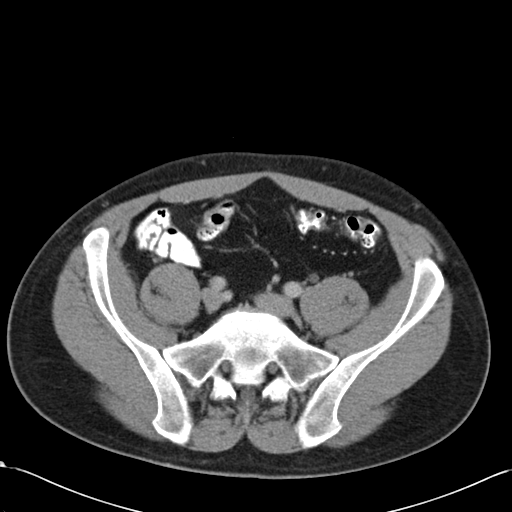
[im 48/101  soft-tissue]
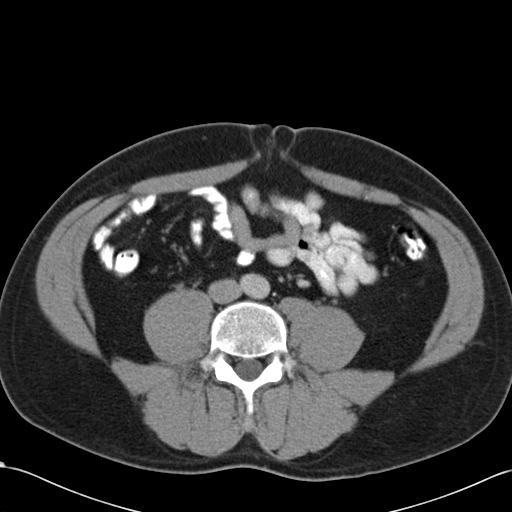
[im 53/101  soft-tissue]
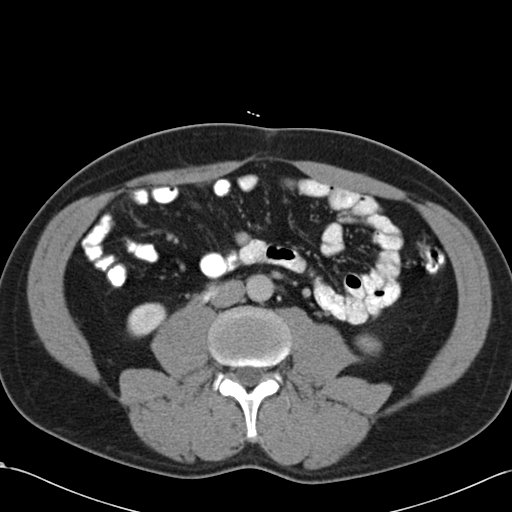
[im 65/101  soft-tissue]
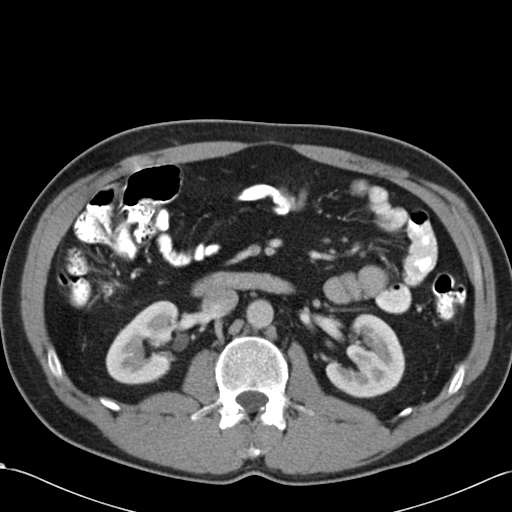
[im 77/101  soft-tissue]
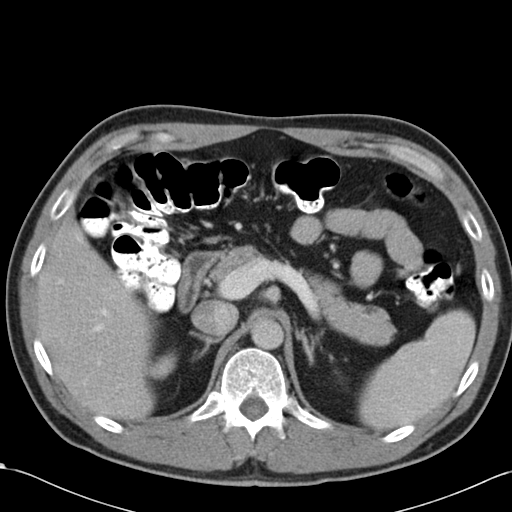
[im 77/101  lung]
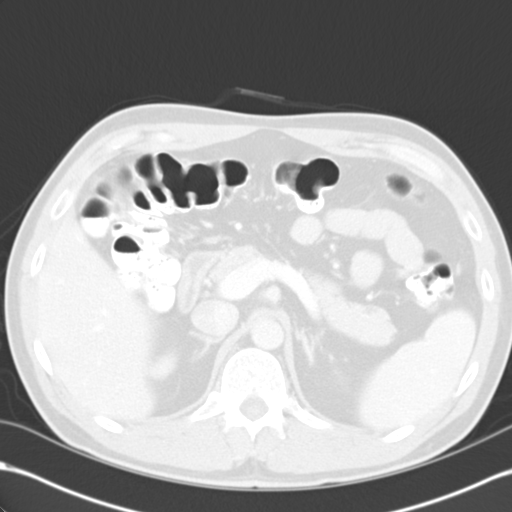
[im 83/101  soft-tissue]
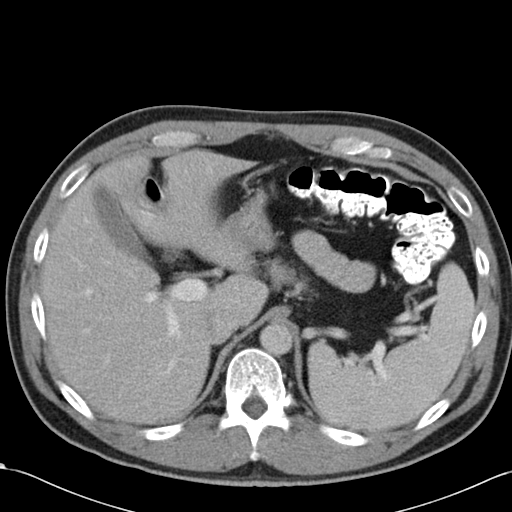
[im 83/101  lung]
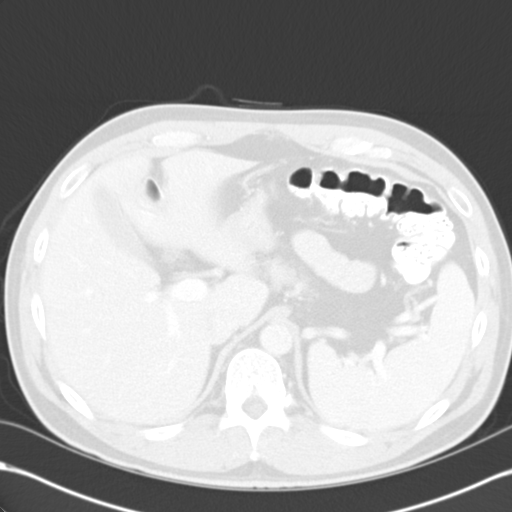
[im 83/101  bone]
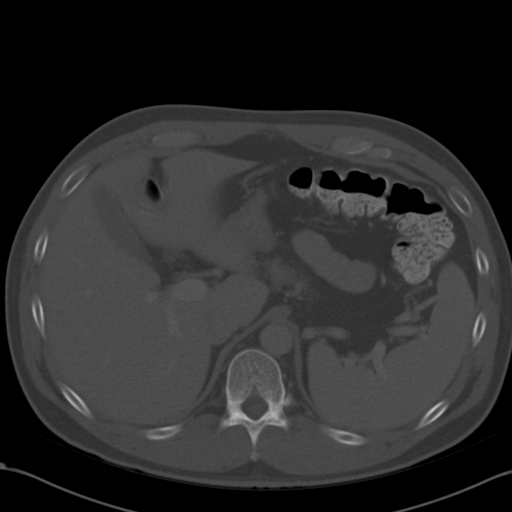
[im 89/101  lung]
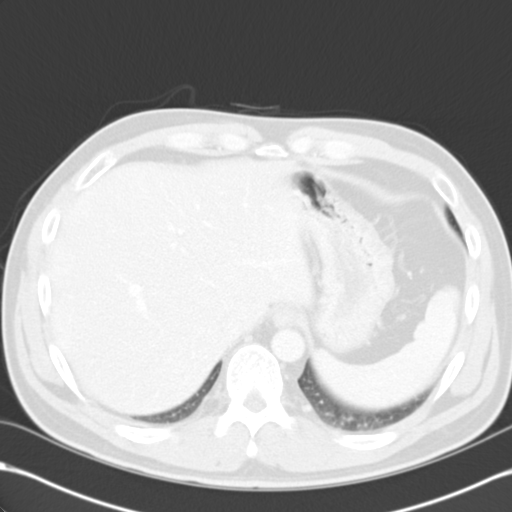
[im 95/101  soft-tissue]
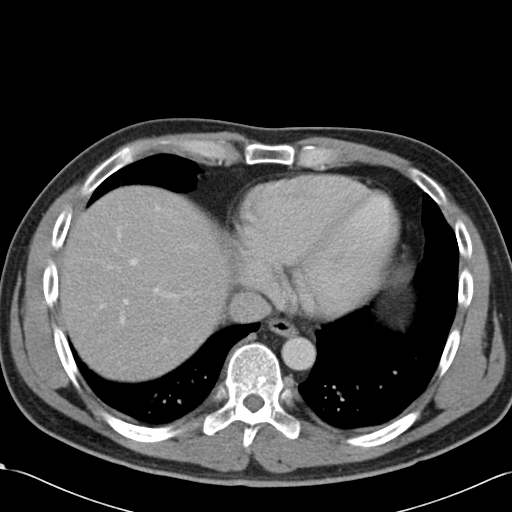
[im 95/101  lung]
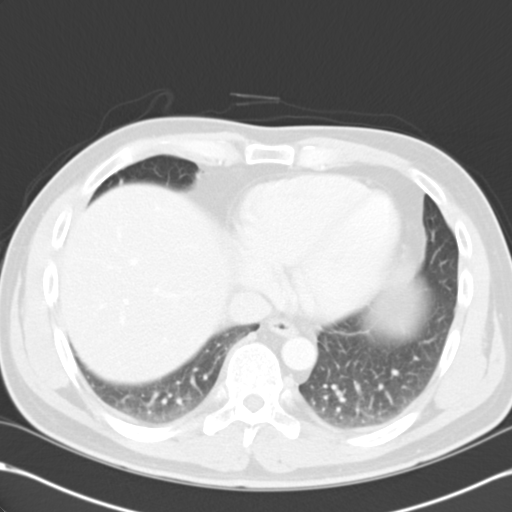

[Series 602: cor w/o · coronal · non-contrast · 0.68mm/px · 3 of 125 slices shown]
[im 32/125  soft-tissue]
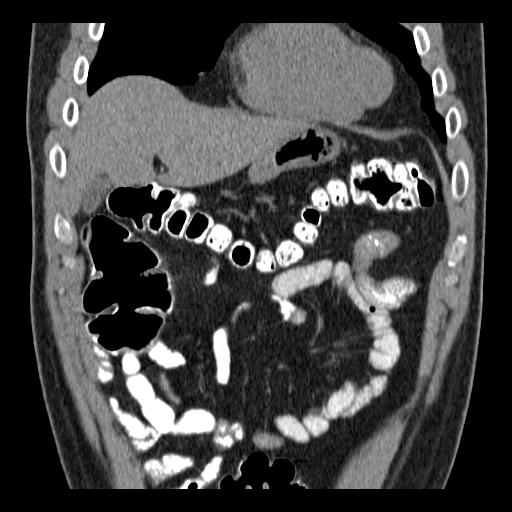
[im 63/125  soft-tissue]
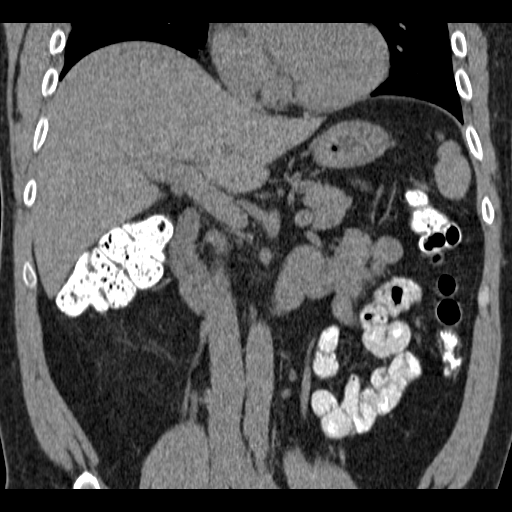
[im 94/125  soft-tissue]
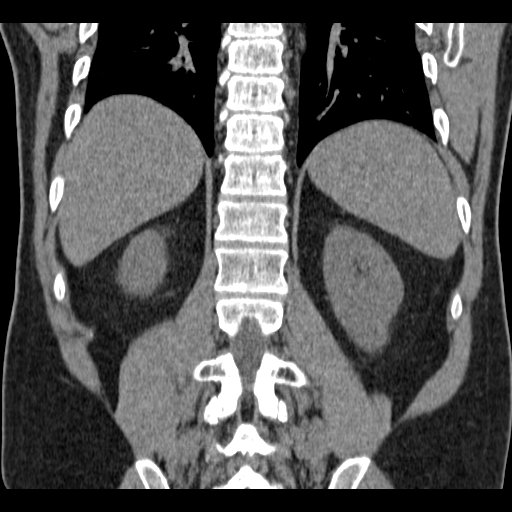

[14 of 46 positions shown; findings below may reference images not displayed]

FINDINGS: Lower chest: There is no pleural or pericardial effusion. The lung
bases appear clear.

Hepatobiliary: No focal liver abnormality identified. The
gallbladder is normal. No biliary dilatation.

Pancreas: The pancreas is unremarkable.

Spleen: The spleen is negative.

Adrenals/Urinary Tract: Normal appearance of the adrenal glands. No
kidney stones or hydronephrosis identified. The urinary bladder is
normal.

Stomach/Bowel: The stomach is within normal limits. The small bowel
loops have a normal course and caliber. No obstruction. Normal
appearance of the colon. The appendix is visualized and appears
normal. No evidence for diverticulitis.

Vascular/Lymphatic: Normal appearance of the abdominal aorta. No
enlarged retroperitoneal or mesenteric adenopathy. No enlarged
pelvic or inguinal lymph nodes.

Reproductive: The prostate gland and seminal vesicles are
unremarkable.

Other: There is no ascites or focal fluid collections within the
abdomen or pelvis. There is a periumbilical hernia which contains
fat only.

Musculoskeletal: No aggressive lytic or sclerotic bone lesion.
IMPRESSION: 1. No acute findings within the abdomen or pelvis. No explanation
for patient's left lower quadrant pain.

## 2017-10-07 ENCOUNTER — Ambulatory Visit: Payer: 59 | Admitting: Adult Health

## 2017-10-07 ENCOUNTER — Encounter: Payer: Self-pay | Admitting: Adult Health

## 2017-10-07 VITALS — BP 100/70 | Temp 98.5°F | Wt 201.0 lb

## 2017-10-07 DIAGNOSIS — Z20818 Contact with and (suspected) exposure to other bacterial communicable diseases: Secondary | ICD-10-CM | POA: Diagnosis not present

## 2017-10-07 LAB — POCT RAPID STREP A (OFFICE): Rapid Strep A Screen: POSITIVE — AB

## 2017-10-07 MED ORDER — PENICILLIN V POTASSIUM 500 MG PO TABS: 500.0000 mg | ORAL_TABLET | Freq: Three times a day (TID) | ORAL | 0 refills | Status: AC

## 2017-10-07 MED ORDER — MAGIC MOUTHWASH W/LIDOCAINE: 5.0000 mL | Freq: Three times a day (TID) | ORAL | 0 refills | Status: DC | PRN

## 2017-10-07 NOTE — Progress Notes (Signed)
Subjective:    Patient ID: Richard DownsJoshua Mathews, male    DOB: 29-Jan-1976, 41 y.o.   MRN: [id number]  Sore Throat   This is a new problem. The current episode started yesterday. Neither side of throat is experiencing more pain than the other. There has been no fever. Associated symptoms include swollen glands and trouble swallowing. Pertinent negatives include no ear discharge or neck pain. He has had exposure to strep. He has tried nothing for the symptoms.      Review of Systems  Constitutional: Negative.   HENT: Positive for postnasal drip, sore throat and trouble swallowing. Negative for ear discharge, rhinorrhea and sinus pain.   Respiratory: Negative.   Cardiovascular: Negative.   Musculoskeletal: Negative for neck pain.  All other systems reviewed and are negative.  History reviewed. No pertinent past medical history.  Social History   Socioeconomic History  . Marital status: Unknown    Spouse name: Richard HaiLee  . Number of children: Not on file  . Years of education: Not on file  . Highest education level: Not on file  Social Needs  . Financial resource strain: Not on file  . Food insecurity - worry: Not on file  . Food insecurity - inability: Not on file  . Transportation needs - medical: Not on file  . Transportation needs - non-medical: Not on file  Occupational History  . Not on file  Tobacco Use  . Smoking status: Never Smoker  Substance and Sexual Activity  . Alcohol use: Yes    Alcohol/week: 0.0 oz    Comment: socailly  . Drug use: No  . Sexual activity: Not on file  Other Topics Concern  . Not on file  Social History Narrative   Wife Richard Mathews(lee) is a patient with MS    Past Surgical History:  Procedure Laterality Date  . EAR MEATOPLASTY WITH FULL THICKNESS SKIN GRAFT    . TYMPANOPLASTY  2006  . VASECTOMY  2016    Family History  Problem Relation Age of Onset  . Heart disease Maternal Grandfather   . Heart attack Paternal Grandfather     No Known  Allergies  No current outpatient medications on file prior to visit.   No current facility-administered medications on file prior to visit.     BP 100/70 (BP Location: Left Arm)   Temp 98.5 F (36.9 C) (Oral)   Wt 201 lb (91.2 kg)   BMI 29.26 kg/m       Objective:   Physical Exam  Constitutional: He is oriented to person, place, and time. He appears well-developed and well-nourished. No distress.  HENT:  Right Ear: Hearing, tympanic membrane, external ear and ear canal normal.  Left Ear: Hearing, tympanic membrane and external ear normal.  Nose: Nose normal. No mucosal edema or rhinorrhea. Right sinus exhibits no maxillary sinus tenderness and no frontal sinus tenderness. Left sinus exhibits no frontal sinus tenderness.  Mouth/Throat: Uvula is midline and mucous membranes are normal. Oropharyngeal exudate, posterior oropharyngeal edema and posterior oropharyngeal erythema present.  Cardiovascular: Normal rate, regular rhythm, normal heart sounds and intact distal pulses. Exam reveals no gallop and no friction rub.  No murmur heard. Pulmonary/Chest: Effort normal and breath sounds normal. No respiratory distress. He has no wheezes. He has no rales. He exhibits no tenderness.  Neurological: He is alert and oriented to person, place, and time.  Skin: Skin is warm and dry. No rash noted. He is not diaphoretic. No erythema. No pallor.  Psychiatric: He has  a normal mood and affect. His behavior is normal. Judgment and thought content normal.  Vitals reviewed.     Assessment & Plan:  1. Strep throat exposure  - POC Rapid Strep A- positive  - magic mouthwash w/lidocaine SOLN; Take 5 mLs 3 (three) times daily as needed by mouth.  Dispense: 180 mL; Refill: 0 - penicillin v potassium (VEETID) 500 MG tablet; Take 1 tablet (500 mg total) 3 (three) times daily for 10 days by mouth.  Dispense: 30 tablet; Refill: 0 - Salt water gargles - Follow up if no improvement   Richard Freesory Tema Alire, NP

## 2017-11-18 ENCOUNTER — Ambulatory Visit: Payer: Self-pay | Admitting: *Deleted

## 2017-11-18 ENCOUNTER — Encounter: Payer: Self-pay | Admitting: Family Medicine

## 2017-11-18 ENCOUNTER — Other Ambulatory Visit: Payer: Self-pay | Admitting: Adult Health

## 2017-11-18 ENCOUNTER — Ambulatory Visit: Payer: 59 | Admitting: Family Medicine

## 2017-11-18 VITALS — BP 138/76 | HR 66 | Temp 98.9°F | Ht 69.5 in | Wt 201.0 lb

## 2017-11-18 DIAGNOSIS — J029 Acute pharyngitis, unspecified: Secondary | ICD-10-CM | POA: Diagnosis not present

## 2017-11-18 DIAGNOSIS — Z20818 Contact with and (suspected) exposure to other bacterial communicable diseases: Secondary | ICD-10-CM

## 2017-11-18 LAB — POCT RAPID STREP A (OFFICE): RAPID STREP A SCREEN: POSITIVE — AB

## 2017-11-18 MED ORDER — PENICILLIN V POTASSIUM 500 MG PO TABS: 500.0000 mg | ORAL_TABLET | Freq: Two times a day (BID) | ORAL | 0 refills | Status: DC

## 2017-11-18 NOTE — Telephone Encounter (Signed)
Pt woke up this morning with symptoms like the ones he had when he was diagnosed strep throat a month ago; his 3 year daughter was diagnosed with strep yesterday;  nurse triage initiated and per protocol pt offered and accepted a 1050 [facility]; pt verbalizes understanding; pt [facility]; will route to [facility] for notification of this encounter Reason for Disposition . [1] Sore throat AND [2] strep throat EXPOSURE (i.e., meets definition) within past 10 days  Answer Assessment - Initial Assessment Questions 1. STREP EXPOSURE: "Was the exposure to someone who lives within your home?" If not, ask: "How much contact did you have with the sick individual?"      Yes constant 39 year old daughter 2. ONSET: "How many days ago did the contact occur?"      1 3. PROVEN STREP: "Are you sure the person with strep had a positive throat culture or rapid strep test?"      yes 4. STREP SYMPTOMS: "Do YOU have a sore throat, fever, or other symptoms suggestive of strep?"      sore throat, drainage affecting breathing, painful to swallow 5. VIRAL SYMPTOMS: "Are there any symptoms of a cold, such as a runny nose, cough, hoarse voice?"     Runny nose 6. PREGNANCY: "Is there any chance you are pregnant?" "When was your last menstrual period?"     n/a  Protocols used: STREP THROAT EXPOSURE-A-AH

## 2017-11-18 NOTE — Patient Instructions (Signed)
Please try things such as zyrtec-D or allegra-D which is an antihistamine and decongestant.   Please try afrin which will help with nasal congestion but use for only three days.   Please also try using a netti pot on a regular occasion.  Honey can help with a sore throat.   Please take a probiotic with the antibiotic

## 2017-11-18 NOTE — Progress Notes (Signed)
  Richard Mathews - 41 y.o. male MRN [id number]  Date of birth: [birth year]  SUBJECTIVE:  Including CC & ROS.  Chief Complaint  Patient presents with  . Sore Throat    Richard DownsJoshua Mathews is a 41 y.o. male that is presenting with sore throat. Has been ongoing for one day. He has taken motrin for the soreness. Pain when swallowing and eating. Denies body aches and chills. Admits headaches and fevers. Has been exposed to strep throat, his three year old daughter. His symptoms have been present for 5 days. He feels like his symptoms are staying the same. He denies any cough.    Review of Systems  Constitutional: Positive for fever.  HENT: Positive for sore throat. Negative for sinus pain.   Respiratory: Negative for cough.   Cardiovascular: Negative for chest pain.  Gastrointestinal: Negative for abdominal pain.    HISTORY: Past Medical, Surgical, Social, and Family History Reviewed & Updated per EMR.   Pertinent Historical Findings include:  No past medical history on file.  Past Surgical History:  Procedure Laterality Date  . EAR MEATOPLASTY WITH FULL THICKNESS SKIN GRAFT    . TYMPANOPLASTY  2006  . VASECTOMY  2016    No Known Allergies  Family History  Problem Relation Age of Onset  . Heart disease Maternal Grandfather   . Heart attack Paternal Grandfather      Social History   Socioeconomic History  . Marital status: Unknown    Spouse name: [name]  . Number of children: Not on file  . Years of education: Not on file  . Highest education level: Not on file  Social Needs  . Financial resource strain: Not on file  . Food insecurity - worry: Not on file  . Food insecurity - inability: Not on file  . Transportation needs - medical: Not on file  . Transportation needs - non-medical: Not on file  Occupational History  . Not on file  Tobacco Use  . Smoking status: Never Smoker  . Smokeless tobacco: Never Used  Substance and Sexual Activity  . Alcohol use: Yes   Alcohol/week: 0.0 oz    Comment: socailly  . Drug use: No  . Sexual activity: Not on file  Other Topics Concern  . Not on file  Social History Narrative   Wife Nurse, children's(lee) is a patient with [facility]     PHYSICAL EXAM:  VS: BP 138/76 (BP Location: Left Arm, Patient Position: Sitting, Cuff Size: Normal)   Pulse 66   Temp 98.9 F (37.2 C) (Oral)   Ht 5' 9.5" (1.765 m)   Wt 201 lb (91.2 kg)   SpO2 99%   BMI 29.26 kg/m  Physical Exam Gen: NAD, alert, cooperative with exam, well-appearing ENT: normal lips, normal nasal mucosa, tympanic membranes clear and intact bilaterally, enlarged tonsils, no cervical lymphadenopathy Eye: normal EOM, normal conjunctiva and lids CV:  no edema, +2 pedal pulses, regular rate and rhythm, S1-S2   Resp: no accessory muscle use, non-labored, clear to auscultation bilaterally, no crackles or wheezes GI: no masses or tenderness, no hernia  Skin: no rashes, no areas of induration  Neuro: normal tone, normal sensation to touch Psych:  normal insight, alert and oriented MSK: Normal gait, normal strength       ASSESSMENT & PLAN:   Sore throat Rapid strep positive with strep exposure  - penicillin  - counseled on supportive care

## 2017-11-18 NOTE — Assessment & Plan Note (Signed)
Rapid strep positive with strep exposure  - penicillin  - counseled on supportive care

## 2018-03-09 ENCOUNTER — Encounter: Payer: Self-pay | Admitting: Adult Health

## 2018-03-09 ENCOUNTER — Ambulatory Visit: Payer: 59 | Admitting: Adult Health

## 2018-03-09 VITALS — BP 106/80 | Temp 98.6°F | Wt 201.0 lb

## 2018-03-09 DIAGNOSIS — J020 Streptococcal pharyngitis: Secondary | ICD-10-CM | POA: Diagnosis not present

## 2018-03-09 LAB — POCT RAPID STREP A (OFFICE): RAPID STREP A SCREEN: POSITIVE — AB

## 2018-03-09 MED ORDER — FLUTICASONE PROPIONATE 50 MCG/ACT NA SUSP: 2.0000 | Freq: Every day | NASAL | 6 refills | Status: DC

## 2018-03-09 MED ORDER — PENICILLIN V POTASSIUM 500 MG PO TABS
500.0000 mg | ORAL_TABLET | Freq: Three times a day (TID) | ORAL | 0 refills | Status: AC
Start: 2018-03-09 — End: 2018-03-19

## 2018-03-09 NOTE — Progress Notes (Signed)
Subjective:    Patient ID: Richard Mathews, male    DOB: [birth year], 42 y.o.   MRN: [id number]  URI   This is a new problem. The current episode started in the past 7 days. There has been no fever. Associated symptoms include ear pain, nausea, a plugged ear sensation and a sore throat. Pertinent negatives include no congestion, coughing, diarrhea, headaches, rhinorrhea, sinus pain, vomiting or wheezing. He has tried antihistamine (Sudafed and Cold and Flu syrup ) for the symptoms. The treatment provided mild relief.    Review of Systems  HENT: Positive for ear pain and sore throat. Negative for congestion, rhinorrhea and sinus pain.   Eyes: Negative.   Respiratory: Negative.  Negative for cough and wheezing.   Gastrointestinal: Positive for nausea. Negative for diarrhea and vomiting.  Endocrine: Negative.   Genitourinary: Negative.   Musculoskeletal: Negative.   Skin: Negative.   Allergic/Immunologic: Negative.   Neurological: Negative for headaches.  Hematological: Negative.   Psychiatric/Behavioral: Negative.   All other systems reviewed and are negative.  History reviewed. No pertinent past medical history.  Social History   Socioeconomic History  . Marital status: Unknown    Spouse name: [name]  . Number of children: Not on file  . Years of education: Not on file  . Highest education level: Not on file  Occupational History  . Not on file  Social Needs  . Financial resource strain: Not on file  . Food insecurity:    Worry: Not on file    Inability: Not on file  . Transportation needs:    Medical: Not on file    Non-medical: Not on file  Tobacco Use  . Smoking status: Never Smoker  . Smokeless tobacco: Never Used  Substance and Sexual Activity  . Alcohol use: Yes    Alcohol/week: 0.0 oz    Comment: socailly  . Drug use: No  . Sexual activity: Not on file  Lifestyle  . Physical activity:    Days per week: Not on file    Minutes per session: Not on file  .  Stress: Not on file  Relationships  . Social connections:    Talks on phone: Not on file    Gets together: Not on file    Attends religious service: Not on file    Active member of club or organization: Not on file    Attends meetings of clubs or organizations: Not on file    Relationship status: Not on file  . Intimate partner violence:    Fear of current or ex partner: Not on file    Emotionally abused: Not on file    Physically abused: Not on file    Forced sexual activity: Not on file  Other Topics Concern  . Not on file  Social History Narrative   Wife [name]) is a patient with MS    Past Surgical History:  Procedure Laterality Date  . EAR MEATOPLASTY WITH FULL THICKNESS SKIN GRAFT    . TYMPANOPLASTY  2006  . VASECTOMY  2016    Family History  Problem Relation Age of Onset  . Heart disease Maternal Grandfather   . Heart attack Paternal Grandfather     No Known Allergies  Current Outpatient Medications on File Prior to Visit  Medication Sig Dispense Refill  . magic mouthwash w/lidocaine SOLN Take 5 mLs 3 (three) times daily as needed by mouth. 180 mL 0   No current facility-administered medications on file prior to visit.  BP 106/80   Temp 98.6 F (37 C)   Wt 201 lb (91.2 kg)   BMI 29.26 kg/m       Objective:   Physical Exam  HENT:  Right Ear: Hearing, tympanic membrane, external ear and ear canal normal.  Left Ear: Hearing, tympanic membrane, external ear and ear canal normal.  Nose: Nose normal. No mucosal edema or rhinorrhea.  Mouth/Throat: Uvula is midline and mucous membranes are normal. Oropharyngeal exudate, posterior oropharyngeal edema and posterior oropharyngeal erythema present. No tonsillar abscesses.      Assessment & Plan:  1. Strep throat  - POC Rapid Strep A- negative. This is the third time in less than 6 months. Will send to ENT for further evaluation.  - penicillin v potassium (VEETID) 500 MG tablet; Take 1 tablet (500 mg total)  by mouth 3 (three) times daily for 10 days.  Dispense: 30 tablet; Refill: 0 - Ambulatory referral to ENT - fluticasone (FLONASE) 50 MCG/ACT nasal spray; Place 2 sprays into both nostrils daily.  Dispense: 16 g; Refill: 6 - Culture, Group A Strep   Shirline Freesory Rayli Wiederhold, NP

## 2018-03-12 LAB — CULTURE, GROUP A STREP
MICRO NUMBER:: 90473220
SPECIMEN QUALITY:: ADEQUATE

## 2018-03-22 DIAGNOSIS — Z8709 Personal history of other diseases of the respiratory system: Secondary | ICD-10-CM | POA: Diagnosis not present

## 2018-03-22 DIAGNOSIS — H9012 Conductive hearing loss, unilateral, left ear, with unrestricted hearing on the contralateral side: Secondary | ICD-10-CM | POA: Diagnosis not present

## 2019-02-01 ENCOUNTER — Encounter: Payer: Self-pay | Admitting: Adult Health

## 2019-02-01 ENCOUNTER — Other Ambulatory Visit: Payer: Self-pay

## 2019-02-01 ENCOUNTER — Ambulatory Visit (INDEPENDENT_AMBULATORY_CARE_PROVIDER_SITE_OTHER): Payer: 59 | Admitting: Adult Health

## 2019-02-01 VITALS — BP 110/80 | Temp 98.6°F | Ht 69.5 in | Wt 210.0 lb

## 2019-02-01 DIAGNOSIS — Z0000 Encounter for general adult medical examination without abnormal findings: Secondary | ICD-10-CM

## 2019-02-01 NOTE — Addendum Note (Signed)
Addended by: Nancy Fetter on: 02/01/2019 02:35 PM   Modules accepted: Level of Service

## 2019-02-01 NOTE — Progress Notes (Signed)
Subjective:    Patient ID: Richard Mathews, male    DOB: 10-14-1976, 43 y.o.   MRN: [id number]  HPI Patient presents for yearly preventative medicine examination. Pleasant 43 year old male who  has no past medical history on file.   He reports that he is back in school getting a masters in social work.  He will be done with this program in spring 2022  His last CPE was in 2016.   All immunizations and health maintenance protocols were reviewed with the patient and needed orders were placed.  Refuses influenza and tetanus  Appropriate screening laboratory values were ordered for the patient including screening of hyperlipidemia, renal function and hepatic function.  Medication reconciliation,  past medical history, social history, problem list and allergies were reviewed in detail with the patient  Goals were established with regard to weight loss, exercise, and  diet in compliance with medications.  He has been eating "moderately healthy" but has not had much time to exercise between school, work, and helping take care of his children. Wt Readings from Last 3 Encounters:  02/01/19 210 lb (95.3 kg)  03/09/18 201 lb (91.2 kg)  11/18/17 201 lb (91.2 kg)    Review of Systems See HPI   History reviewed. No pertinent past medical history.  Social History   Socioeconomic History  . Marital status: Unknown    Spouse name: [name]  . Number of children: Not on file  . Years of education: Not on file  . Highest education level: Not on file  Occupational History  . Not on file  Social Needs  . Financial resource strain: Not on file  . Food insecurity:    Worry: Not on file    Inability: Not on file  . Transportation needs:    Medical: Not on file    Non-medical: Not on file  Tobacco Use  . Smoking status: Never Smoker  . Smokeless tobacco: Never Used  Substance and Sexual Activity  . Alcohol use: Yes    Alcohol/week: 0.0 standard drinks    Comment: socailly  . Drug use: No  .  Sexual activity: Not on file  Lifestyle  . Physical activity:    Days per week: Not on file    Minutes per session: Not on file  . Stress: Not on file  Relationships  . Social connections:    Talks on phone: Not on file    Gets together: Not on file    Attends religious service: Not on file    Active member of club or organization: Not on file    Attends meetings of clubs or organizations: Not on file    Relationship status: Not on file  . Intimate partner violence:    Fear of current or ex partner: Not on file    Emotionally abused: Not on file    Physically abused: Not on file    Forced sexual activity: Not on file  Other Topics Concern  . Not on file  Social History Narrative   Wife [name]) is a patient with MS    Past Surgical History:  Procedure Laterality Date  . EAR MEATOPLASTY WITH FULL THICKNESS SKIN GRAFT    . TYMPANOPLASTY  2006  . VASECTOMY  2016    Family History  Problem Relation Age of Onset  . Heart disease Maternal Grandfather   . Heart attack Paternal Grandfather     No Known Allergies  No current outpatient medications on file prior to visit.  No current facility-administered medications on file prior to visit.     BP 110/80   Temp 98.6 F (37 C)   Ht 5' 9.5" (1.765 m)   Wt 210 lb (95.3 kg)   BMI 30.57 kg/m       Objective:   Physical Exam Vitals signs and nursing note reviewed.  Constitutional:      General: He is not in acute distress.    Appearance: Normal appearance. He is well-developed. He is not diaphoretic.     Comments: Overweight   HENT:     Head: Normocephalic and atraumatic.     Right Ear: Tympanic membrane, ear canal and external ear normal. There is no impacted cerumen.     Left Ear: Tympanic membrane, ear canal and external ear normal. There is no impacted cerumen.     Nose: Nose normal. No congestion or rhinorrhea.     Mouth/Throat:     Mouth: Mucous membranes are moist.     Pharynx: Oropharynx is clear. No  oropharyngeal exudate or posterior oropharyngeal erythema.  Eyes:     General:        Right eye: No discharge.        Left eye: No discharge.     Extraocular Movements: Extraocular movements intact.     Conjunctiva/sclera: Conjunctivae normal.     Pupils: Pupils are equal, round, and reactive to light.  Neck:     Musculoskeletal: Normal range of motion and neck supple. No neck rigidity or muscular tenderness.     Thyroid: No thyromegaly.     Vascular: No carotid bruit.     Trachea: No tracheal deviation.  Cardiovascular:     Rate and Rhythm: Normal rate and regular rhythm.     Pulses: Normal pulses.     Heart sounds: Normal heart sounds. No murmur. No friction rub. No gallop.   Pulmonary:     Effort: Pulmonary effort is normal. No respiratory distress.     Breath sounds: Normal breath sounds. No stridor. No wheezing, rhonchi or rales.  Chest:     Chest wall: No tenderness.  Abdominal:     General: Bowel sounds are normal. There is no distension.     Palpations: Abdomen is soft. There is no mass.     Tenderness: There is no abdominal tenderness. There is no guarding or rebound.  Musculoskeletal: Normal range of motion.        General: No swelling, tenderness, deformity or signs of injury.     Right lower leg: No edema.     Left lower leg: No edema.  Lymphadenopathy:     Cervical: No cervical adenopathy.  Skin:    General: Skin is warm and dry.     Coloration: Skin is not jaundiced or pale.     Findings: No bruising, erythema, lesion or rash.  Neurological:     General: No focal deficit present.     Mental Status: He is alert and oriented to person, place, and time.     Cranial Nerves: No cranial nerve deficit.     Coordination: Coordination normal.  Psychiatric:        Mood and Affect: Mood normal.        Behavior: Behavior normal.        Thought Content: Thought content normal.        Judgment: Judgment normal.       Assessment & Plan:  1. Routine general medical  examination at a health care facility -Encouraged weight loss  through lifestyle modifications. -Follow up in 1 year or sooner if needed - CBC with Differential/Platelet; Future - Comprehensive metabolic panel; Future - Lipid panel; Future - TSH; Future   Shirline Frees, NP

## 2019-02-03 ENCOUNTER — Other Ambulatory Visit (INDEPENDENT_AMBULATORY_CARE_PROVIDER_SITE_OTHER): Payer: 59

## 2019-02-03 ENCOUNTER — Other Ambulatory Visit: Payer: Self-pay

## 2019-02-03 DIAGNOSIS — Z0000 Encounter for general adult medical examination without abnormal findings: Secondary | ICD-10-CM

## 2019-02-03 LAB — COMPREHENSIVE METABOLIC PANEL
ALT: 31 U/L (ref 0–53)
AST: 18 U/L (ref 0–37)
Albumin: 4.7 g/dL (ref 3.5–5.2)
Alkaline Phosphatase: 55 U/L (ref 39–117)
BUN: 13 mg/dL (ref 6–23)
CHLORIDE: 104 meq/L (ref 96–112)
CO2: 27 mEq/L (ref 19–32)
CREATININE: 1.08 mg/dL (ref 0.40–1.50)
Calcium: 9.4 mg/dL (ref 8.4–10.5)
GFR: 75.33 mL/min (ref >60.00)
Glucose, Bld: 91 mg/dL (ref 70–99)
Potassium: 4.4 mEq/L (ref 3.5–5.1)
Sodium: 139 mEq/L (ref 135–145)
Total Bilirubin: 0.8 mg/dL (ref 0.2–1.2)
Total Protein: 7.1 g/dL (ref 6.0–8.3)

## 2019-02-03 LAB — CBC WITH DIFFERENTIAL/PLATELET
Basophils Absolute: 0 10*3/uL (ref 0.0–0.1)
Basophils Relative: 0.3 % (ref 0.0–3.0)
Eosinophils Absolute: 0.1 10*3/uL (ref 0.0–0.7)
Eosinophils Relative: 2.1 % (ref 0.0–5.0)
HCT: 44.5 % (ref 39.0–52.0)
Hemoglobin: 15.5 g/dL (ref 13.0–17.0)
LYMPHS PCT: 30.9 % (ref 12.0–46.0)
Lymphs Abs: 1.7 10*3/uL (ref 0.7–4.0)
MCHC: 34.9 g/dL (ref 30.0–36.0)
MCV: 95.1 fl (ref 78.0–100.0)
Monocytes Absolute: 0.5 10*3/uL (ref 0.1–1.0)
Monocytes Relative: 9.5 % (ref 3.0–12.0)
Neutro Abs: 3.1 10*3/uL (ref 1.4–7.7)
Neutrophils Relative %: 57.2 % (ref 43.0–77.0)
Platelets: 149 10*3/uL — ABNORMAL LOW (ref 150.0–400.0)
RBC: 4.68 Mil/uL (ref 4.22–5.81)
RDW: 12.5 % (ref 11.5–15.5)
WBC: 5.5 10*3/uL (ref 4.0–10.5)

## 2019-02-03 LAB — LIPID PANEL
Cholesterol: 223 mg/dL — ABNORMAL HIGH (ref 0–200)
HDL: 39.7 mg/dL (ref >39.00)
LDL Cholesterol: 150 mg/dL — ABNORMAL HIGH (ref 0–99)
NonHDL: 183.08
Total CHOL/HDL Ratio: 6
Triglycerides: 167 mg/dL — ABNORMAL HIGH (ref 0.0–149.0)
VLDL: 33.4 mg/dL (ref 0.0–40.0)

## 2019-02-03 LAB — TSH: TSH: 3.38 u[IU]/mL (ref 0.35–4.50)

## 2020-05-06 ENCOUNTER — Other Ambulatory Visit: Payer: Self-pay

## 2020-05-07 ENCOUNTER — Other Ambulatory Visit: Payer: Self-pay | Admitting: Family Medicine

## 2020-05-07 ENCOUNTER — Encounter: Payer: Self-pay | Admitting: Adult Health

## 2020-05-07 ENCOUNTER — Other Ambulatory Visit (INDEPENDENT_AMBULATORY_CARE_PROVIDER_SITE_OTHER): Payer: 59

## 2020-05-07 ENCOUNTER — Ambulatory Visit (INDEPENDENT_AMBULATORY_CARE_PROVIDER_SITE_OTHER): Payer: 59 | Admitting: Adult Health

## 2020-05-07 VITALS — BP 110/78 | Temp 97.9°F | Ht 69.5 in | Wt 206.0 lb

## 2020-05-07 DIAGNOSIS — N529 Male erectile dysfunction, unspecified: Secondary | ICD-10-CM | POA: Diagnosis not present

## 2020-05-07 DIAGNOSIS — K429 Umbilical hernia without obstruction or gangrene: Secondary | ICD-10-CM

## 2020-05-07 DIAGNOSIS — Z0000 Encounter for general adult medical examination without abnormal findings: Secondary | ICD-10-CM

## 2020-05-07 LAB — LIPID PANEL
Cholesterol: 230 mg/dL — ABNORMAL HIGH (ref 0–200)
HDL: 47.9 mg/dL (ref >39.00)
LDL Cholesterol: 150 mg/dL — ABNORMAL HIGH (ref 0–99)
NonHDL: 182.25
Total CHOL/HDL Ratio: 5
Triglycerides: 159 mg/dL — ABNORMAL HIGH (ref 0.0–149.0)
VLDL: 31.8 mg/dL (ref 0.0–40.0)

## 2020-05-07 LAB — CBC WITH DIFFERENTIAL/PLATELET
Basophils Absolute: 0 10*3/uL (ref 0.0–0.1)
Basophils Relative: 0.7 % (ref 0.0–3.0)
Eosinophils Absolute: 0.1 10*3/uL (ref 0.0–0.7)
Eosinophils Relative: 2 % (ref 0.0–5.0)
HCT: 46.9 % (ref 39.0–52.0)
Hemoglobin: 16.3 g/dL (ref 13.0–17.0)
Lymphocytes Relative: 18.9 % (ref 12.0–46.0)
Lymphs Abs: 1.3 10*3/uL (ref 0.7–4.0)
MCHC: 34.7 g/dL (ref 30.0–36.0)
MCV: 96.3 fl (ref 78.0–100.0)
Monocytes Absolute: 0.6 10*3/uL (ref 0.1–1.0)
Monocytes Relative: 8.1 % (ref 3.0–12.0)
Neutro Abs: 5 10*3/uL (ref 1.4–7.7)
Neutrophils Relative %: 70.3 % (ref 43.0–77.0)
Platelets: 146 10*3/uL — ABNORMAL LOW (ref 150.0–400.0)
RBC: 4.87 Mil/uL (ref 4.22–5.81)
RDW: 12.9 % (ref 11.5–15.5)
WBC: 7.1 10*3/uL (ref 4.0–10.5)

## 2020-05-07 LAB — COMPREHENSIVE METABOLIC PANEL
ALT: 36 U/L (ref 0–53)
AST: 21 U/L (ref 0–37)
Albumin: 4.8 g/dL (ref 3.5–5.2)
Alkaline Phosphatase: 54 U/L (ref 39–117)
BUN: 13 mg/dL (ref 6–23)
CO2: 24 mEq/L (ref 19–32)
Calcium: 9.5 mg/dL (ref 8.4–10.5)
Chloride: 103 mEq/L (ref 96–112)
Creatinine, Ser: 0.97 mg/dL (ref 0.40–1.50)
GFR: 84.75 mL/min (ref >60.00)
Glucose, Bld: 96 mg/dL (ref 70–99)
Potassium: 3.8 mEq/L (ref 3.5–5.1)
Sodium: 135 mEq/L (ref 135–145)
Total Bilirubin: 0.9 mg/dL (ref 0.2–1.2)
Total Protein: 7.6 g/dL (ref 6.0–8.3)

## 2020-05-07 LAB — TSH: TSH: 4.5 u[IU]/mL (ref 0.35–4.50)

## 2020-05-07 MED ORDER — ROSUVASTATIN CALCIUM 5 MG PO TABS
5.0000 mg | ORAL_TABLET | Freq: Every day | ORAL | 3 refills | Status: DC
Start: 2020-05-07 — End: 2021-06-13

## 2020-05-07 MED ORDER — SILDENAFIL CITRATE 20 MG PO TABS: 20.0000 mg | ORAL_TABLET | Freq: Every day | ORAL | 1 refills | Status: DC | PRN

## 2020-05-07 NOTE — Progress Notes (Signed)
Subjective:    Patient ID: Richard Mathews, male    DOB: February 07, 1976, 44 y.o.   MRN: [id number]  HPI  Patient presents for yearly preventative medicine examination. He is a pleasant 44 year old male who  has no past medical history on file.  Hyperlipidemia -not currently on any medications due to age.  He was advised to work on lifestyle modifications first.   ED -reports that he has noticed that he has been having more trouble with maintaining an erection rather than getting an erection.  He still has erections in the morning when he wakes up.  He does drink alcohol "more than I should" and has not been exercising.  He has noticed that when he does drink more alcohol that he is not able to maintain the erection.  Umbilical Hernia-he has had an umbilical hernia for some time now but over the last few months the umbilical hernia has become more painful and does not reducible as easily.  He would like to be evaluated for surgery.  He denies issues with bowel movements, bruising around the hernia, or abdominal pain  All immunizations and health maintenance protocols were reviewed with the patient and needed orders were placed. He is up to date on vaccinations   Appropriate screening laboratory values were ordered for the patient including screening of hyperlipidemia, renal function and hepatic function.  Medication reconciliation,  past medical history, social history, problem list and allergies were reviewed in detail with the patient  Goals were established with regard to weight loss, exercise, and  diet in compliance with medications.  He has not been exercising on a routine basis, he does try and watch his diet eat a heart healthy diet but this does not always happen.  Wt Readings from Last 3 Encounters:  05/07/20 206 lb (93.4 kg)  02/01/19 210 lb (95.3 kg)  03/09/18 201 lb (91.2 kg)   Review of Systems  Constitutional: Negative.   HENT: Negative.   Eyes: Negative.   Respiratory:  Negative.   Cardiovascular: Negative.   Endocrine: Negative.   Genitourinary: Negative.   Musculoskeletal: Negative.   Skin: Negative.   Allergic/Immunologic: Negative.   Neurological: Negative.   Hematological: Negative.   Psychiatric/Behavioral: Negative.   All other systems reviewed and are negative.  History reviewed. No pertinent past medical history.  Social History   Socioeconomic History   Marital status: Unknown    Spouse name: [name]   Number of children: Not on file   Years of education: Not on file   Highest education level: Not on file  Occupational History   Not on file  Tobacco Use   Smoking status: Never Smoker   Smokeless tobacco: Never Used  Substance and Sexual Activity   Alcohol use: Yes    Alcohol/week: 0.0 standard drinks    Comment: socailly   Drug use: No   Sexual activity: Not on file  Other Topics Concern   Not on file  Social History Narrative   Wife [name]) is a patient with MS   Social Determinants of Health   Financial Resource Strain:    Difficulty of Paying Living Expenses:   Food Insecurity:    Worried About Programme researcher, broadcasting/film/video in the Last Year:    Barista in the Last Year:   Transportation Needs:    Freight forwarder (Medical):    Lack of Transportation (Non-Medical):   Physical Activity:    Days of Exercise per Week:  Minutes of Exercise per Session:   Stress:    Feeling of Stress :   Social Connections:    Frequency of Communication with Friends and Family:    Frequency of Social Gatherings with Friends and Family:    Attends Religious Services:    Active Member of Clubs or Organizations:    Attends Music therapist:    Marital Status:   Intimate Partner Violence:    Fear of Current or Ex-Partner:    Emotionally Abused:    Physically Abused:    Sexually Abused:     Past Surgical History:  Procedure Laterality Date   EAR MEATOPLASTY WITH FULL THICKNESS SKIN  GRAFT     TYMPANOPLASTY  2006   VASECTOMY  2016    Family History  Problem Relation Age of Onset   Heart disease Maternal Grandfather    Heart attack Paternal Grandfather     No Known Allergies  No current outpatient medications on file prior to visit.   No current facility-administered medications on file prior to visit.    BP 110/78    Temp 97.9 F (36.6 C)    Ht 5' 9.5" (1.765 m)    Wt 206 lb (93.4 kg)    BMI 29.98 kg/m       Objective:   Physical Exam Vitals and nursing note reviewed.  Constitutional:      General: He is not in acute distress.    Appearance: Normal appearance. He is well-developed and normal weight.  HENT:     Head: Normocephalic and atraumatic.     Right Ear: Tympanic membrane, ear canal and external ear normal. There is no impacted cerumen.     Left Ear: Tympanic membrane, ear canal and external ear normal. There is no impacted cerumen.     Nose: Nose normal. No congestion or rhinorrhea.     Mouth/Throat:     Mouth: Mucous membranes are moist.     Pharynx: Oropharynx is clear. No oropharyngeal exudate or posterior oropharyngeal erythema.  Eyes:     General:        Right eye: No discharge.        Left eye: No discharge.     Extraocular Movements: Extraocular movements intact.     Conjunctiva/sclera: Conjunctivae normal.     Pupils: Pupils are equal, round, and reactive to light.  Neck:     Vascular: No carotid bruit.     Trachea: No tracheal deviation.  Cardiovascular:     Rate and Rhythm: Normal rate and regular rhythm.     Pulses: Normal pulses.     Heart sounds: Normal heart sounds. No murmur heard.  No friction rub. No gallop.   Pulmonary:     Effort: Pulmonary effort is normal. No respiratory distress.     Breath sounds: Normal breath sounds. No stridor. No wheezing, rhonchi or rales.  Chest:     Chest wall: No tenderness.  Abdominal:     General: Bowel sounds are normal. There is no distension.     Palpations: Abdomen is  soft. There is no mass.     Tenderness: There is no abdominal tenderness. There is no right CVA tenderness, left CVA tenderness, guarding or rebound.     Hernia: A hernia (umbilical. Is reducable ) is present.  Musculoskeletal:        General: No swelling, tenderness, deformity or signs of injury. Normal range of motion.     Right lower leg: No edema.  Left lower leg: No edema.  Lymphadenopathy:     Cervical: No cervical adenopathy.  Skin:    General: Skin is warm and dry.     Capillary Refill: Capillary refill takes less than 2 seconds.     Coloration: Skin is not jaundiced or pale.     Findings: No bruising, erythema, lesion or rash.  Neurological:     General: No focal deficit present.     Mental Status: He is alert and oriented to person, place, and time.     Cranial Nerves: No cranial nerve deficit.     Sensory: No sensory deficit.     Motor: No weakness.     Coordination: Coordination normal.     Gait: Gait normal.     Deep Tendon Reflexes: Reflexes normal.  Psychiatric:        Mood and Affect: Mood normal.        Behavior: Behavior normal.        Thought Content: Thought content normal.        Judgment: Judgment normal.       Assessment & Plan:  1. Routine general medical examination at a health care facility -Needs to work on weight loss through diet and exercise -Follow-up in 1 year or sooner if needed - CBC with Differential/Platelet; Future - Comprehensive metabolic panel; Future - Lipid panel; Future - TSH; Future  2. Erectile dysfunction, unspecified erectile dysfunction type -Encouraged to cut back on drinking as well as exercise.  Will provide short course of Viagra to see if this helps.  He was made aware of the side effects of this medication - sildenafil (REVATIO) 20 MG tablet; Take 1 tablet (20 mg total) by mouth daily as needed.  Dispense: 10 tablet; Refill: 1  3. Umbilical hernia without obstruction and without gangrene  - Ambulatory referral to  General Surgery   Shirline Frees, NP

## 2020-05-07 NOTE — Patient Instructions (Signed)
It was great seeing you today   We will follow up with you once your labs are back   I have referred you to [facility] [place] - they will call you to schedule your appointment   Work on weight loss through diet and exercise

## 2020-05-13 ENCOUNTER — Telehealth: Payer: Self-pay | Admitting: Adult Health

## 2020-05-13 NOTE — Telephone Encounter (Signed)
Patient is waiting to hear from the hernia surgery place but has not heard anything and wanted a call back.

## 2020-05-14 NOTE — Telephone Encounter (Signed)
Spoke to the pt and gave him the telephone number to call and follow up for appointment.  Nothing further needed.

## 2020-05-14 NOTE — Telephone Encounter (Signed)
Referral uploaded on  proficient  [facility],  Address: [address], [place], [place] [address] Phone: [phone] Fax [phone]

## 2020-08-06 ENCOUNTER — Other Ambulatory Visit: Payer: Self-pay

## 2020-08-06 ENCOUNTER — Emergency Department (HOSPITAL_COMMUNITY): Payer: 59

## 2020-08-06 ENCOUNTER — Emergency Department (HOSPITAL_COMMUNITY)
Admission: EM | Admit: 2020-08-06 | Discharge: 2020-08-07 | Disposition: A | Discharge status: Home / Self Care | Payer: 59 | Attending: Emergency Medicine | Admitting: Emergency Medicine

## 2020-08-06 ENCOUNTER — Encounter (HOSPITAL_COMMUNITY): Payer: Self-pay | Admitting: Emergency Medicine

## 2020-08-06 DIAGNOSIS — Z79899 Other long term (current) drug therapy: Secondary | ICD-10-CM | POA: Diagnosis not present

## 2020-08-06 DIAGNOSIS — R1011 Right upper quadrant pain: Secondary | ICD-10-CM | POA: Insufficient documentation

## 2020-08-06 DIAGNOSIS — R079 Chest pain, unspecified: Secondary | ICD-10-CM | POA: Diagnosis not present

## 2020-08-06 HISTORY — DX: Pure hypercholesterolemia, unspecified: E78.00

## 2020-08-06 LAB — BASIC METABOLIC PANEL
Anion gap: 10 (ref 5–15)
BUN: 11 mg/dL (ref 6–20)
CO2: 24 mmol/L (ref 22–32)
Calcium: 9.3 mg/dL (ref 8.9–10.3)
Chloride: 104 mmol/L (ref 98–111)
Creatinine, Ser: 1.08 mg/dL (ref 0.61–1.24)
GFR calc Af Amer: >60 mL/min (ref >60)
GFR calc non Af Amer: >60 mL/min (ref >60)
Glucose, Bld: 91 mg/dL (ref 70–99)
Potassium: 4 mmol/L (ref 3.5–5.1)
Sodium: 138 mmol/L (ref 135–145)

## 2020-08-06 LAB — TROPONIN I (HIGH SENSITIVITY): Troponin I (High Sensitivity): <2 ng/L (ref <18)

## 2020-08-06 LAB — CBC
HCT: 46.8 % (ref 39.0–52.0)
Hemoglobin: 15.5 g/dL (ref 13.0–17.0)
MCH: 32.8 pg (ref 26.0–34.0)
MCHC: 33.1 g/dL (ref 30.0–36.0)
MCV: 99.2 fL (ref 80.0–100.0)
Platelets: 149 10*3/uL — ABNORMAL LOW (ref 150–400)
RBC: 4.72 MIL/uL (ref 4.22–5.81)
RDW: 11.9 % (ref 11.5–15.5)
WBC: 5.2 10*3/uL (ref 4.0–10.5)
nRBC: 0 % (ref 0.0–0.2)

## 2020-08-06 NOTE — ED Triage Notes (Signed)
Pt reports RUQ/chest pain with indigestion that has been going on for about 1 week. Reports indigestion worse than normal.

## 2020-08-07 ENCOUNTER — Emergency Department (HOSPITAL_COMMUNITY): Payer: 59

## 2020-08-07 LAB — HEPATIC FUNCTION PANEL
ALT: 43 U/L (ref 0–44)
AST: 23 U/L (ref 15–41)
Albumin: 4.5 g/dL (ref 3.5–5.0)
Alkaline Phosphatase: 49 U/L (ref 38–126)
Bilirubin, Direct: 0.1 mg/dL (ref 0.0–0.2)
Indirect Bilirubin: 0.8 mg/dL (ref 0.3–0.9)
Total Bilirubin: 0.9 mg/dL (ref 0.3–1.2)
Total Protein: 7.2 g/dL (ref 6.5–8.1)

## 2020-08-07 LAB — LIPASE, BLOOD: Lipase: 32 U/L (ref 11–51)

## 2020-08-07 MED ORDER — OXYCODONE HCL 5 MG PO TABS
5.0000 mg | ORAL_TABLET | Freq: Once | ORAL | Status: AC
  Administered 2020-08-07: 5 mg via ORAL
  Filled 2020-08-07: qty 1

## 2020-08-07 MED ORDER — OXYCODONE-ACETAMINOPHEN 5-325 MG PO TABS: 1.0000 | ORAL_TABLET | ORAL | 0 refills | Status: DC | PRN

## 2020-08-07 NOTE — ED Provider Notes (Signed)
[facility] EMERGENCY DEPARTMENT Provider Note   CSN: [id number] Arrival date & time: 08/06/20  1311     History Chief Complaint  Patient presents with  . Chest Pain  . Abdominal Pain    Richard Mathews is a 44 y.o. male.  The history is provided by the patient and medical records.    44 year old male with history of high cholesterol, recently started on statin therapy, presenting to the ED with right upper abdominal pain.  States for the past week he has had a dull, nagging pain in his right upper abdomen.  States his skin overlying this feels somewhat "itchy".  He has not had any nausea/vomiting, has been able to eat/drink fine.  Normal BMs.  No fever/chills.  States yesterday he started getting some indigestion, slightly worse than normal but that has since resolved.  Denies chest pain or SOB.  No diaphoresis.  No pain or shoulder/neck area.  No cardiac history.  He is not a smoker.  Does have family cardiac history (both grandfathers and uncle).  Past Medical History:  Diagnosis Date  . High cholesterol     Patient Active Problem List   Diagnosis Date Noted  . Sore throat 11/18/2017    Past Surgical History:  Procedure Laterality Date  . EAR MEATOPLASTY WITH FULL THICKNESS SKIN GRAFT    . TYMPANOPLASTY  2006  . VASECTOMY  2016       Family History  Problem Relation Age of Onset  . Heart disease Maternal Grandfather   . Heart attack Paternal Grandfather     Social History   Tobacco Use  . Smoking status: Never Smoker  . Smokeless tobacco: Never Used  Substance Use Topics  . Alcohol use: Yes    Alcohol/week: 0.0 standard drinks    Comment: socailly  . Drug use: No    Home Medications Prior to Admission medications   Medication Sig Start Date End Date Taking? Authorizing Provider  rosuvastatin (CRESTOR) 5 MG tablet Take 1 tablet (5 mg total) by mouth at bedtime. 05/07/20   Nafziger, Kandee Keen, NP  sildenafil (REVATIO) 20 MG tablet Take 1  tablet (20 mg total) by mouth daily as needed. 05/07/20   Shirline Frees, NP    Allergies    Patient has no known allergies.  Review of Systems   Review of Systems  Gastrointestinal: Positive for abdominal pain.  All other systems reviewed and are negative.   Physical Exam Updated Vital Signs BP 112/86 (BP Location: Left Arm)   Pulse 63   Temp 98.7 F (37.1 C) (Oral)   Resp 20   SpO2 99%   Physical Exam Vitals and nursing note reviewed.  Constitutional:      Appearance: He is well-developed.  HENT:     Head: Normocephalic and atraumatic.  Eyes:     Conjunctiva/sclera: Conjunctivae normal.     Pupils: Pupils are equal, round, and reactive to light.  Cardiovascular:     Rate and Rhythm: Normal rate and regular rhythm.     Heart sounds: Normal heart sounds.  Pulmonary:     Effort: Pulmonary effort is normal.     Breath sounds: Normal breath sounds.  Abdominal:     General: Bowel sounds are normal.     Palpations: Abdomen is soft.     Tenderness: There is abdominal tenderness in the right upper quadrant and epigastric area.     Comments: TTP RUQ and epigastrium without murphy's sign, no distention or fluid wave noted, umbilical  hernia present that is easily reducible, no rash or other overlying skin changes  Musculoskeletal:        General: Normal range of motion.     Cervical back: Normal range of motion.  Skin:    General: Skin is warm and dry.  Neurological:     Mental Status: He is alert and oriented to person, place, and time.     ED Results / Procedures / Treatments   Labs (all labs ordered are listed, but only abnormal results are displayed) Labs Reviewed  CBC - Abnormal; Notable for the following components:      Result Value   Platelets 149 (*)    All other components within normal limits  BASIC METABOLIC PANEL  HEPATIC FUNCTION PANEL  LIPASE, BLOOD  TROPONIN I (HIGH SENSITIVITY)    EKG EKG Interpretation  Date/Time:  Tuesday August 06 2020  13:17:10 EDT Ventricular Rate:  79 PR Interval:  156 QRS Duration: 100 QT Interval:  392 QTC Calculation: 449 R Axis:   77 Text Interpretation: Normal sinus rhythm Cannot rule out Inferior infarct , age undetermined Abnormal ECG No old tracing to compare Confirmed by Dione Booze ([id number]) on 08/06/2020 11:20:46 PM   Radiology DG Chest 2 View  Result Date: 08/06/2020 CLINICAL DATA:  Chest pain. EXAM: CHEST - 2 VIEW COMPARISON:  None. FINDINGS: The heart size and mediastinal contours are within normal limits. Both lungs are clear. No pneumothorax or pleural effusion is noted. The visualized skeletal structures are unremarkable. IMPRESSION: No active cardiopulmonary disease. Electronically Signed   By: Lupita Raider M.D.   On: 08/06/2020 13:58   US Abdomen Limited RUQ  Result Date: 08/07/2020 CLINICAL DATA:  Right upper quadrant pain for 1 week EXAM: ULTRASOUND ABDOMEN LIMITED RIGHT UPPER QUADRANT COMPARISON:  Abdomen and pelvis CT 10/11/2015 FINDINGS: Gallbladder: No gallstones or wall thickening visualized. No sonographic Murphy sign noted by sonographer. Common bile duct: Diameter: 4 mm Liver: Echogenic liver with sparing at the gallbladder fossa. Portal vein is patent on color Doppler imaging with normal direction of blood flow towards the liver. IMPRESSION: 1. Hepatic steatosis. 2. Negative gallbladder. Electronically Signed   By: Marnee Spring M.D.   On: 08/07/2020 05:39    Procedures Procedures (including critical care time)  Medications Ordered in ED Medications - No data to display  ED Course  I have reviewed the triage vital signs and the nursing notes.  Pertinent labs & imaging results that were available during my care of the patient were reviewed by me and considered in my medical decision making (see chart for details).    MDM Rules/Calculators/A&P  44 year old male presenting to the ED with 1 week of right upper quadrant abdominal pain.  States he had some indigestion  yesterday but that is since subsided.  He has not had any chest pain or shortness of breath.  EKG is nonischemic.  Troponin is negative.  Labs are grossly reassuring.  On exam he does have tenderness in his right upper quadrant and epigastrium without Murphy sign.  Umbilical hernia noted which is chronic, this is easily reducible.  There is no rash or other overlying skin changes to suggest shingles.  Will add on LFTs, lipase, and obtain right upper quadrant ultrasound.  Dose of pain medication given.  Hepatic panel and lipase within normal limits.  Ultrasound with findings of fatty liver but no gallstones.  Results discussed with patient and his wife.  Feeling better after oral pain medication here.  Plan to  discharge home with symptomatic care and close PCP follow-up.  Return here for any new/acute changes.  Final Clinical Impression(s) / ED Diagnoses Final diagnoses:  RUQ pain    Rx / DC Orders ED Discharge Orders         Ordered    oxyCODONE-acetaminophen (PERCOCET) 5-325 MG tablet  Every 4 hours PRN        08/07/20 0605           Garlon Hatchet, PA-C 08/07/20 2080    Dione Booze, MD 08/07/20 [phone]

## 2020-08-07 NOTE — Discharge Instructions (Signed)
Labs and ultrasound today were reassuring.  Cardiac tests looked good as well. We do recommend that you follow-up with your primary care doctor.  Copies of labs/imaging on back for their review. Return here for new/acute changes-- high fever, vomiting, etc.

## 2020-08-14 ENCOUNTER — Other Ambulatory Visit: Payer: Self-pay

## 2020-08-14 ENCOUNTER — Telehealth (INDEPENDENT_AMBULATORY_CARE_PROVIDER_SITE_OTHER): Payer: 59 | Admitting: Adult Health

## 2020-08-14 ENCOUNTER — Encounter: Payer: Self-pay | Admitting: Adult Health

## 2020-08-14 DIAGNOSIS — R1011 Right upper quadrant pain: Secondary | ICD-10-CM | POA: Diagnosis not present

## 2020-08-14 MED ORDER — OMEPRAZOLE 20 MG PO CPDR: 20.0000 mg | DELAYED_RELEASE_CAPSULE | Freq: Every day | ORAL | 0 refills | Status: DC

## 2020-08-14 NOTE — Progress Notes (Signed)
Virtual Visit via Video Note  I connected with Richard Mathews on 08/14/20 at 11:00 AM EDT by a video enabled telemedicine application and verified that I am speaking with the correct person using two identifiers.  Location patient: home Location provider:work or home office Persons participating in the virtual visit: patient, provider  I discussed the limitations of evaluation and management by telemedicine and the availability of in person appointments. The patient expressed understanding and agreed to proceed.   HPI: 44 year old male who  has a past medical history of High cholesterol.  He is being evaluated today after being seen in the ER.  He was seen at [facility] emergency room 8 days ago for right upper abdominal pain.  His pain had been present for a week.  Pain was described as dull and nagging pain in his right upper abdomen.  He felt as though the skin overlying this feeling was "itchy"..  He denied nausea or vomiting and had been able to do p.o. intake without difficulty.  He also denied fevers and chills, constipation or diarrhea.  The day before presenting to the emergency room he has some acid reflux-like symptoms that were slightly worse than normal but had since resolved.  In the ER his work-up was unremarkable, EKG was nonischemic.  Troponin was negative.  Labs were grossly reassuring.  On exam he had some tenderness in the right upper quadrant and epigastric area without Murphy sign.  He does have a umbilical hernia which we knew about but this was easily reducible.  There was no rash or overlying skin changes to suggest shingles.  His LFTs, lipase and right upper quadrant ultrasound were negative except for ultrasound showing fatty liver disease.  He was discharged home and reports that although his pain has decreased from a constant 6-7 he is still having some intermittent pain that is mostly a 2 out of 3 but will spike to a 6 out of 10.  Pain does radiate at times to his mid  back.  Has not noticed any pain after eating until last night when he stopped at [facility] and got some [place] fries, he had some worsening discomfort when laying down.  He has had few episodes of diarrhea over the last 2 days but otherwise otherwise his stools are normal.   ROS: See pertinent positives and negatives per HPI.  Past Medical History:  Diagnosis Date  . High cholesterol     Past Surgical History:  Procedure Laterality Date  . EAR MEATOPLASTY WITH FULL THICKNESS SKIN GRAFT    . TYMPANOPLASTY  2006  . VASECTOMY  2016    Family History  Problem Relation Age of Onset  . Heart disease Maternal Grandfather   . Heart attack Paternal Grandfather        Current Outpatient Medications:  .  oxyCODONE-acetaminophen (PERCOCET) 5-325 MG tablet, Take 1 tablet by mouth every 4 (four) hours as needed., Disp: 12 tablet, Rfl: 0 .  rosuvastatin (CRESTOR) 5 MG tablet, Take 1 tablet (5 mg total) by mouth at bedtime., Disp: 90 tablet, Rfl: 3 .  sildenafil (REVATIO) 20 MG tablet, Take 1 tablet (20 mg total) by mouth daily as needed., Disp: 10 tablet, Rfl: 1 .  omeprazole (PRILOSEC) 20 MG capsule, Take 1 capsule (20 mg total) by mouth daily., Disp: 30 capsule, Rfl: 0  EXAM:  VITALS per patient if applicable:  GENERAL: alert, oriented, appears well and in no acute distress  HEENT: atraumatic, conjunttiva clear, no obvious abnormalities  on inspection of external nose and ears  NECK: normal movements of the head and neck  LUNGS: on inspection no signs of respiratory distress, breathing rate appears normal, no obvious gross SOB, gasping or wheezing  CV: no obvious cyanosis  MS: moves all visible extremities without noticeable abnormality  PSYCH/NEURO: pleasant and cooperative, no obvious depression or anxiety, speech and thought processing grossly intact  ASSESSMENT AND PLAN:  Discussed the following assessment and plan: 1. RUQ abdominal pain - Reviewed ER notes, imaging, and  labs  -He looks well and is in no acute distress.  Even though ultrasound was negative for acute process of gallbladder.  May need to do CT scan of abdomen in the future.  We will trial him on Prilosec in case he is having some referred pain from GERD or is developing a duodenal ulcer.  He was encouraged to stay away from spicy/acidic/fatty foods as well as alcohol.  We will follow-up with me in 2 weeks via MyChart to let me know how he is doing or sooner if pain worsens - omeprazole (PRILOSEC) 20 MG capsule; Take 1 capsule (20 mg total) by mouth daily.  Dispense: 30 capsule; Refill: 0     I discussed the assessment and treatment plan with the patient. The patient was provided an opportunity to ask questions and all were answered. The patient agreed with the plan and demonstrated an understanding of the instructions.   The patient was advised to call back or seek an in-person evaluation if the symptoms worsen or if the condition fails to improve as anticipated.   Shirline Frees, NP

## 2020-08-27 ENCOUNTER — Encounter: Payer: Self-pay | Admitting: Adult Health

## 2020-08-27 NOTE — Telephone Encounter (Signed)
I'm not sure why this was forwarded to me since I haven't seen patient (if Kandee Keen in he can review, but otherwise ok to forward my response to patient)? But looks like [place] referred him to gen surg back in June and they were supposed to call/set up visit. If he is feeling ok and interested in hernia surgery, then he can contact gen surg office:  [facility],  Address: [address], [place], [place] [address] Phone: [phone]

## 2020-09-26 ENCOUNTER — Ambulatory Visit: Payer: Self-pay | Admitting: General Surgery

## 2020-10-10 NOTE — Patient Instructions (Addendum)
DUE TO COVID-19 ONLY ONE VISITOR IS ALLOWED TO COME WITH YOU AND STAY IN THE WAITING ROOM ONLY DURING PRE OP AND PROCEDURE DAY OF SURGERY. THE 1 VISITOR  MAY VISIT WITH YOU AFTER SURGERY IN YOUR PRIVATE ROOM DURING VISITING HOURS ONLY!  YOU NEED TO HAVE A COVID 19 TEST ON_11/29______ @8 :00_______, THIS TEST MUST BE DONE BEFORE SURGERY,  COVID TESTING SITE [address] [place] , IT IS ON THE RIGHT GOING OUT [address] APPROXIMATELY  2 MINUTES PAST [employer] ON THE RIGHT. ONCE YOUR COVID TEST IS COMPLETED,  PLEASE BEGIN THE QUARANTINE INSTRUCTIONS AS OUTLINED IN YOUR HANDOUT.                Richard Mathews    Your procedure is scheduled on: 10/24/20   Report to [facility] Main  Entrance   Report to Short Stay at 5:30 AM     Call this number if you have problems the morning of surgery [phone]    . BRUSH YOUR TEETH MORNING OF SURGERY AND RINSE YOUR MOUTH OUT, NO CHEWING GUM CANDY OR MINTS.   No food after midnight.    You may have clear liquid until 4:30 AM.    At 4:30 AM drink pre surgery drink.   Nothing by mouth after 4:30 AM.    Take these medicines the morning of surgery with A SIP OF WATER: None                                 You may not have any metal on your body including              piercings  Do not wear jewelry,  lotions, powders or deodorant                     Men may shave face and neck.   Do not bring valuables to the hospital. [facility] IS NOT             RESPONSIBLE   FOR VALUABLES.  Contacts, dentures or bridgework may not be worn into surgery.      Patients discharged the day of surgery will not be allowed to drive home.   IF YOU ARE HAVING SURGERY AND GOING HOME THE SAME DAY, YOU MUST HAVE AN ADULT TO DRIVE YOU HOME AND BE WITH YOU FOR 24 HOURS.   YOU MAY GO HOME BY TAXI OR UBER OR ORTHERWISE, BUT AN ADULT MUST ACCOMPANY YOU HOME AND STAY WITH YOU FOR 24 HOURS.  Name and phone number of your  driver:  Special Instructions: N/A              Please read over the following fact sheets you were given: _____________________________________________________________________             [facility] - Preparing for Surgery Before surgery, you can play an important role.   Because skin is not sterile, your skin needs to be as free of germs as possible.   You can reduce the number of germs on your skin by washing with CHG (chlorahexidine gluconate) soap before surgery.   CHG is an antiseptic cleaner which kills germs and bonds with the skin to continue killing germs even after washing. Please DO NOT use if you have an allergy to CHG or antibacterial soaps.   If your skin becomes reddened/irritated stop using the CHG and  inform your nurse when you arrive at Short Stay.  You may shave your face/neck.  Please follow these instructions carefully:  1.  Shower with CHG Soap the night before surgery and the  morning of Surgery.  2.  If you choose to wash your hair, wash your hair first as usual with your  normal  shampoo.  3.  After you shampoo, rinse your hair and body thoroughly to remove the  shampoo.                                        4.  Use CHG as you would any other liquid soap.  You can apply chg directly  to the skin and wash                       Gently with a scrungie or clean washcloth.  5.  Apply the CHG Soap to your body ONLY FROM THE NECK DOWN.   Do not use on face/ open                           Wound or open sores. Avoid contact with eyes, ears mouth and genitals (private parts).                       Wash face,  Genitals (private parts) with your normal soap.             6.  Wash thoroughly, paying special attention to the area where your surgery  will be performed.  7.  Thoroughly rinse your body with warm water from the neck down.  8.  DO NOT shower/wash with your normal soap after using and rinsing off  the CHG Soap.             9.  Pat yourself dry with a clean  towel.            10.  Wear clean pajamas.            11.  Place clean sheets on your bed the night of your first shower and do not  sleep with pets. Day of Surgery : Do not apply any lotions/deodorants the morning of surgery.  Please wear clean clothes to the hospital/surgery center.  FAILURE TO FOLLOW THESE INSTRUCTIONS MAY RESULT IN THE CANCELLATION OF YOUR SURGERY PATIENT SIGNATURE_________________________________  NURSE SIGNATURE__________________________________  ________________________________________________________________________

## 2020-10-14 ENCOUNTER — Other Ambulatory Visit: Payer: Self-pay

## 2020-10-14 ENCOUNTER — Encounter (HOSPITAL_COMMUNITY)
Admission: RE | Admit: 2020-10-14 | Discharge: 2020-10-14 | Disposition: A | Discharge status: Home / Self Care | Payer: 59 | Source: Ambulatory Visit | Attending: General Surgery | Admitting: General Surgery

## 2020-10-14 ENCOUNTER — Encounter (HOSPITAL_COMMUNITY): Payer: Self-pay

## 2020-10-14 DIAGNOSIS — Z01812 Encounter for preprocedural laboratory examination: Secondary | ICD-10-CM | POA: Insufficient documentation

## 2020-10-14 LAB — CBC
HCT: 46.2 % (ref 39.0–52.0)
Hemoglobin: 15.6 g/dL (ref 13.0–17.0)
MCH: 32.8 pg (ref 26.0–34.0)
MCHC: 33.8 g/dL (ref 30.0–36.0)
MCV: 97.1 fL (ref 80.0–100.0)
Platelets: 130 10*3/uL — ABNORMAL LOW (ref 150–400)
RBC: 4.76 MIL/uL (ref 4.22–5.81)
RDW: 11.9 % (ref 11.5–15.5)
WBC: 5.2 10*3/uL (ref 4.0–10.5)
nRBC: 0 % (ref 0.0–0.2)

## 2020-10-14 NOTE — Progress Notes (Signed)
COVID Vaccine Completed:Yes Date COVID Vaccine completed:04/15/20 COVID vaccine manufacturer: Pfizer     PCP - Gaffer - no  Chest x-ray - no EKG - no Stress Test - no ECHO - no Cardiac Cath - no Pacemaker/ICD device last checked:NA  Sleep Study - no CPAP -   Fasting Blood Sugar - NA Checks Blood Sugar _____ times a day  Blood Thinner Instructions:NA Aspirin Instructions: Last Dose:  Anesthesia review:   Patient denies shortness of breath, fever, cough and chest pain at PAT appointment yes   Patient verbalized understanding of instructions that were given to them at the PAT appointment. Patient was also instructed that they will need to review over the PAT instructions again at home before surgery. Yes Pt ia active and has no SOB with activity.

## 2020-10-21 ENCOUNTER — Other Ambulatory Visit (HOSPITAL_COMMUNITY)
Admission: RE | Admit: 2020-10-21 | Discharge: 2020-10-21 | Disposition: A | Discharge status: Home / Self Care | Payer: 59 | Source: Ambulatory Visit | Attending: General Surgery | Admitting: General Surgery

## 2020-10-21 DIAGNOSIS — Z20822 Contact with and (suspected) exposure to covid-19: Secondary | ICD-10-CM | POA: Diagnosis not present

## 2020-10-21 DIAGNOSIS — Z01812 Encounter for preprocedural laboratory examination: Secondary | ICD-10-CM | POA: Diagnosis present

## 2020-10-21 LAB — SARS CORONAVIRUS 2 (TAT 6-24 HRS): SARS Coronavirus 2: NEGATIVE

## 2020-10-23 MED ORDER — BUPIVACAINE LIPOSOME 1.3 % IJ SUSP
20.0000 mL | INTRAMUSCULAR | Status: DC
  Filled 2020-10-23: qty 20

## 2020-10-24 ENCOUNTER — Ambulatory Visit (HOSPITAL_COMMUNITY): Admission: RE | Admit: 2020-10-24 | Payer: 59 | Source: Home / Self Care | Admitting: General Surgery

## 2020-10-24 ENCOUNTER — Encounter (HOSPITAL_COMMUNITY): Admission: RE | Payer: Self-pay | Source: Home / Self Care

## 2020-10-24 SURGERY — REPAIR, HERNIA, UMBILICAL, ADULT
Anesthesia: General

## 2021-01-22 IMAGING — US US ABDOMEN LIMITED
1 series · 14 of 25 positions shown · non-contrast
Comparison: Abdomen and pelvis CT 10/11/2015

CLINICAL DATA: Right upper quadrant pain for 1 week

EXAM:
ULTRASOUND ABDOMEN LIMITED RIGHT UPPER QUADRANT

[Series 1: us abdomen limited ruq · 14 of 39 slices shown]
[im 1/39]
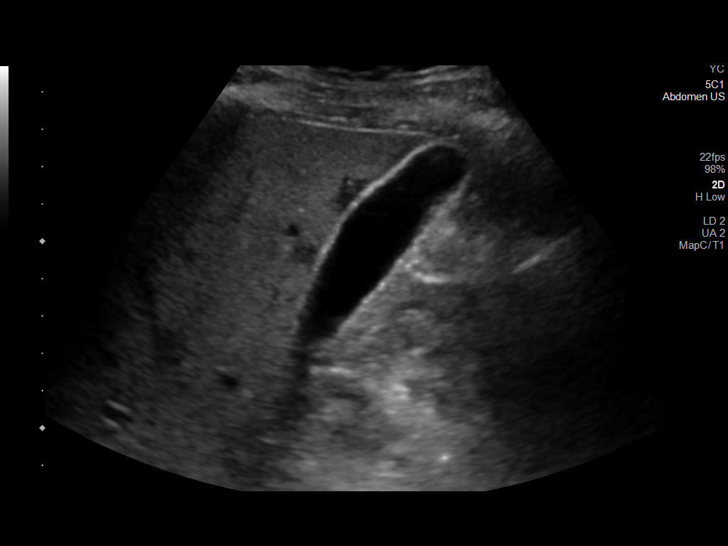
[im 4/39]
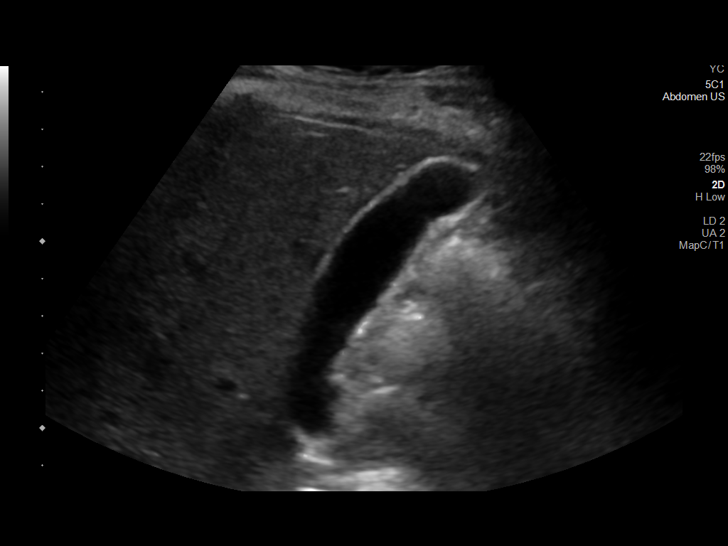
[im 7/39]
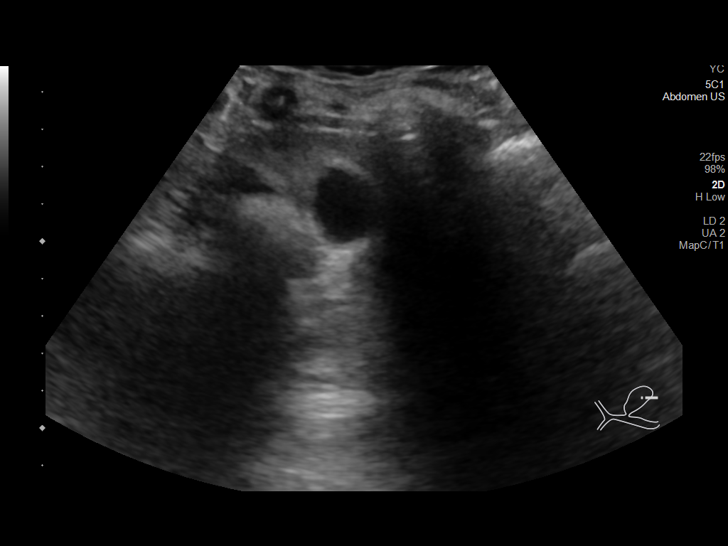
[im 10/39]
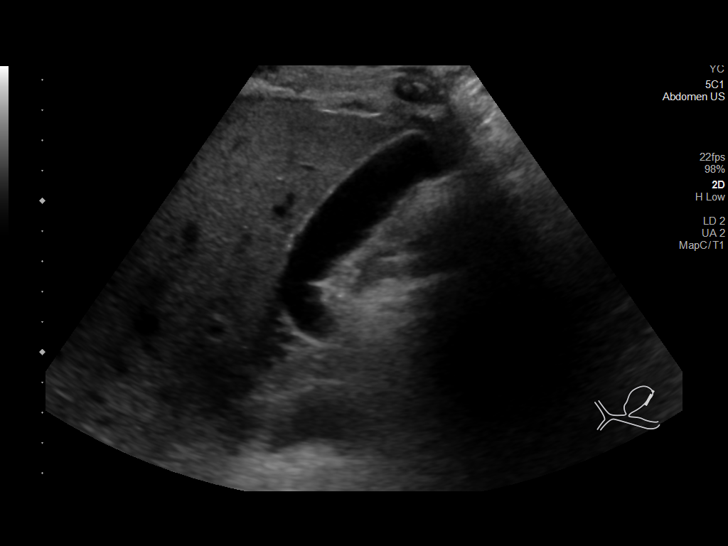
[im 13/39]
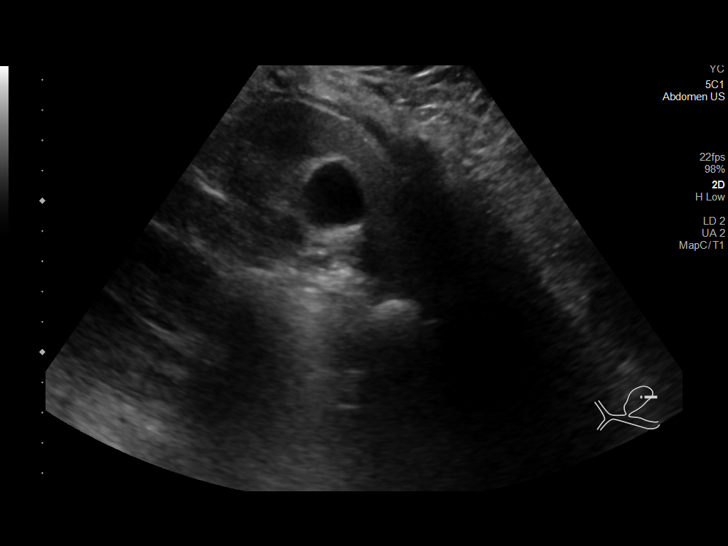
[im 15/39]
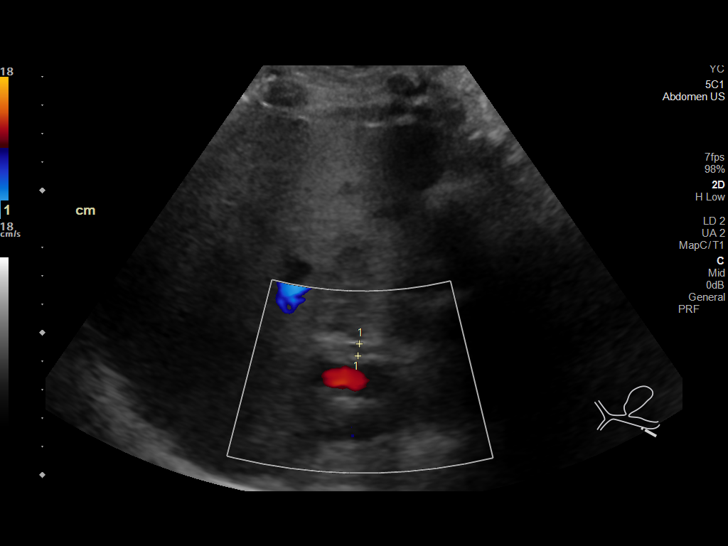
[im 18/39]
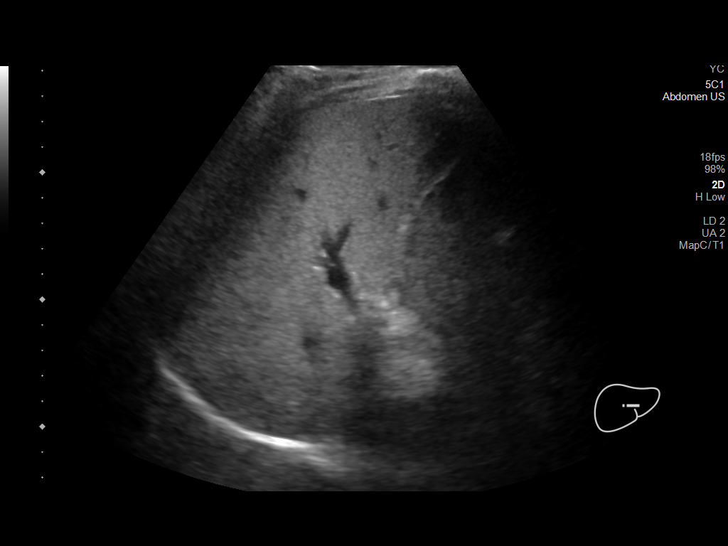
[im 21/39]
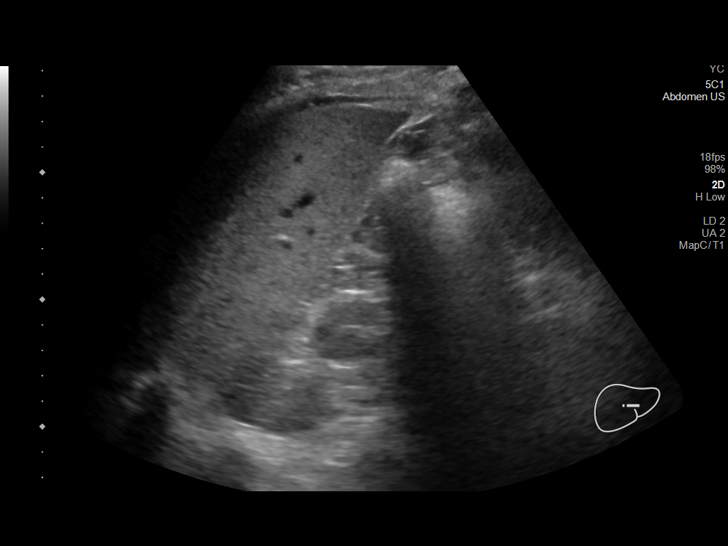
[im 24/39]
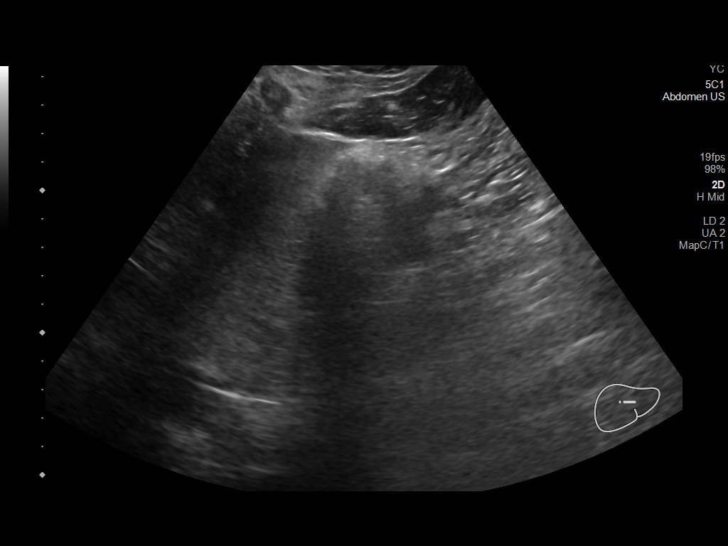
[im 26/39]
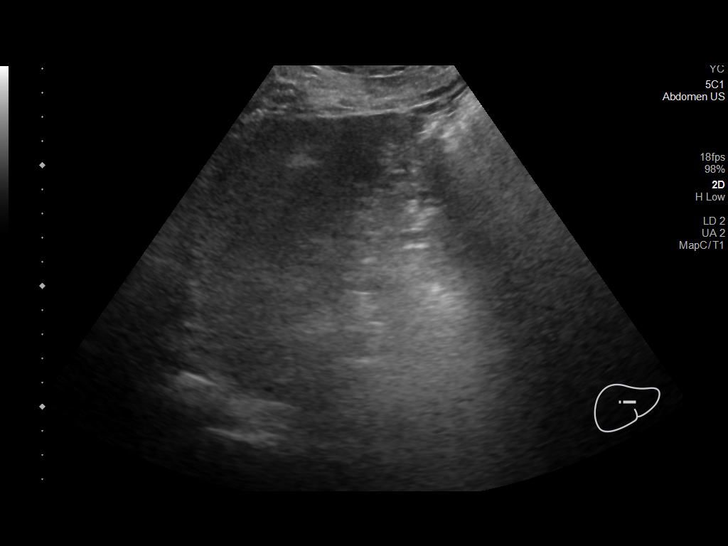
[im 29/39]
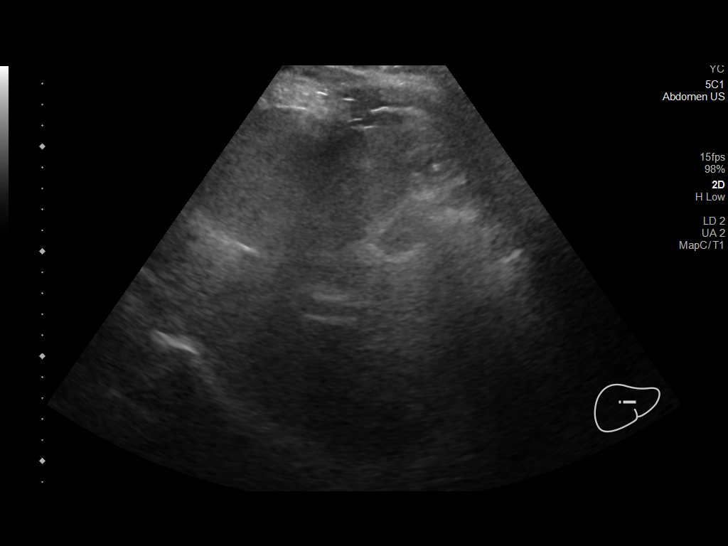
[im 32/39]
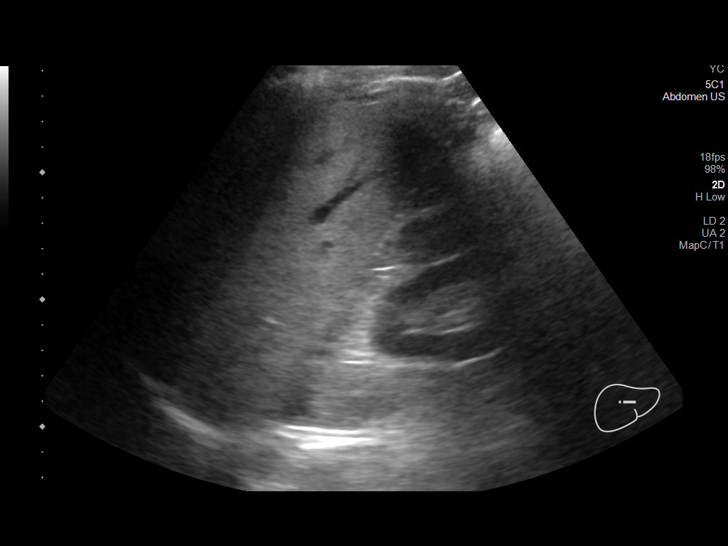
[im 35/39]
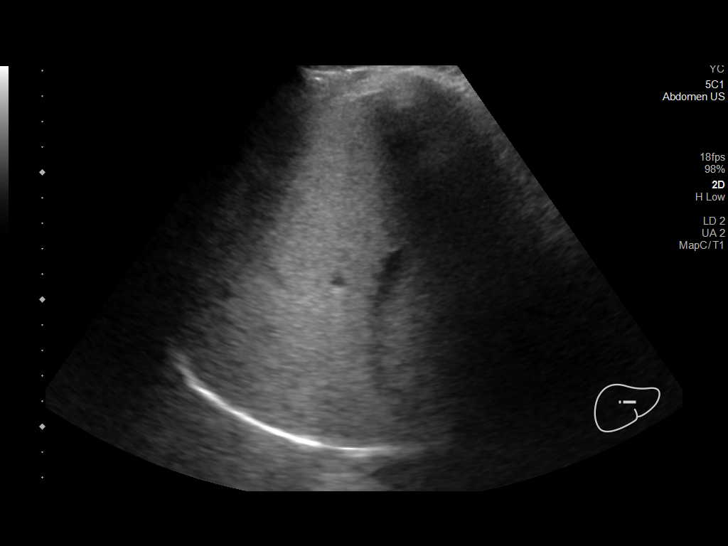
[im 39/39]
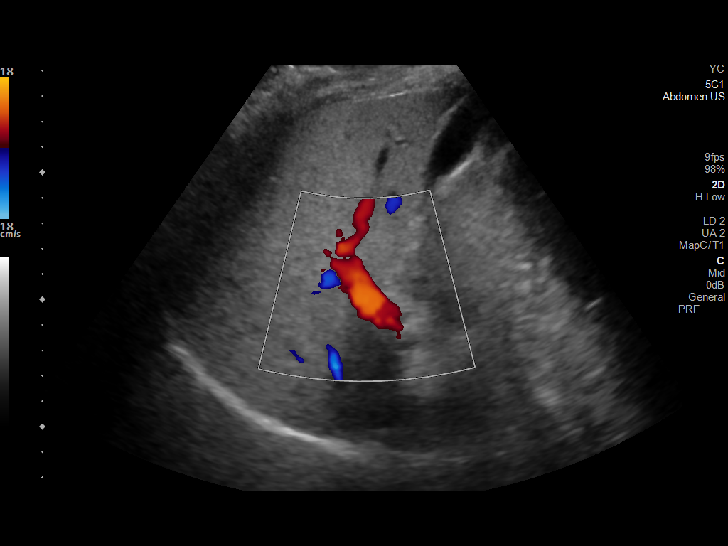

[14 of 25 positions shown; findings below may reference images not displayed]

FINDINGS: Gallbladder:

No gallstones or wall thickening visualized. No sonographic Murphy
sign noted by sonographer.

Common bile duct:

Diameter: 4 mm

Liver:

Echogenic liver with sparing at the gallbladder fossa. Portal vein
is patent on color Doppler imaging with normal direction of blood
flow towards the liver.
IMPRESSION: 1. Hepatic steatosis.
2. Negative gallbladder.

## 2021-06-12 ENCOUNTER — Other Ambulatory Visit: Payer: Self-pay

## 2021-06-13 ENCOUNTER — Ambulatory Visit (INDEPENDENT_AMBULATORY_CARE_PROVIDER_SITE_OTHER): Payer: 59 | Admitting: Adult Health

## 2021-06-13 ENCOUNTER — Encounter: Payer: Self-pay | Admitting: Adult Health

## 2021-06-13 ENCOUNTER — Other Ambulatory Visit: Payer: Self-pay | Admitting: Adult Health

## 2021-06-13 VITALS — BP 110/88 | HR 65 | Temp 98.6°F | Ht 70.0 in | Wt 207.0 lb

## 2021-06-13 DIAGNOSIS — F5103 Paradoxical insomnia: Secondary | ICD-10-CM

## 2021-06-13 DIAGNOSIS — N529 Male erectile dysfunction, unspecified: Secondary | ICD-10-CM | POA: Diagnosis not present

## 2021-06-13 DIAGNOSIS — Z0000 Encounter for general adult medical examination without abnormal findings: Secondary | ICD-10-CM

## 2021-06-13 DIAGNOSIS — E782 Mixed hyperlipidemia: Secondary | ICD-10-CM | POA: Diagnosis not present

## 2021-06-13 LAB — CBC WITH DIFFERENTIAL/PLATELET
Basophils Absolute: 0 10*3/uL (ref 0.0–0.1)
Basophils Relative: 0.5 % (ref 0.0–3.0)
Eosinophils Absolute: 0.2 10*3/uL (ref 0.0–0.7)
Eosinophils Relative: 3 % (ref 0.0–5.0)
HCT: 46.4 % (ref 39.0–52.0)
Hemoglobin: 15.4 g/dL (ref 13.0–17.0)
Lymphocytes Relative: 27.5 % (ref 12.0–46.0)
Lymphs Abs: 1.4 10*3/uL (ref 0.7–4.0)
MCHC: 33.2 g/dL (ref 30.0–36.0)
MCV: 98 fl (ref 78.0–100.0)
Monocytes Absolute: 0.6 10*3/uL (ref 0.1–1.0)
Monocytes Relative: 11.4 % (ref 3.0–12.0)
Neutro Abs: 3 10*3/uL (ref 1.4–7.7)
Neutrophils Relative %: 57.6 % (ref 43.0–77.0)
Platelets: 152 10*3/uL (ref 150.0–400.0)
RBC: 4.74 Mil/uL (ref 4.22–5.81)
RDW: 12.6 % (ref 11.5–15.5)
WBC: 5.3 10*3/uL (ref 4.0–10.5)

## 2021-06-13 LAB — COMPREHENSIVE METABOLIC PANEL
ALT: 49 U/L (ref 0–53)
AST: 23 U/L (ref 0–37)
Albumin: 4.6 g/dL (ref 3.5–5.2)
Alkaline Phosphatase: 51 U/L (ref 39–117)
BUN: 17 mg/dL (ref 6–23)
CO2: 26 mEq/L (ref 19–32)
Calcium: 9.3 mg/dL (ref 8.4–10.5)
Chloride: 105 mEq/L (ref 96–112)
Creatinine, Ser: 1.02 mg/dL (ref 0.40–1.50)
GFR: 90.09 mL/min (ref >60.00)
Glucose, Bld: 83 mg/dL (ref 70–99)
Potassium: 4 mEq/L (ref 3.5–5.1)
Sodium: 140 mEq/L (ref 135–145)
Total Bilirubin: 0.8 mg/dL (ref 0.2–1.2)
Total Protein: 6.9 g/dL (ref 6.0–8.3)

## 2021-06-13 LAB — LIPID PANEL
Cholesterol: 155 mg/dL (ref 0–200)
HDL: 48.6 mg/dL (ref >39.00)
LDL Cholesterol: 90 mg/dL (ref 0–99)
NonHDL: 106.24
Total CHOL/HDL Ratio: 3
Triglycerides: 81 mg/dL (ref 0.0–149.0)
VLDL: 16.2 mg/dL (ref 0.0–40.0)

## 2021-06-13 LAB — TSH: TSH: 4.27 u[IU]/mL (ref 0.35–5.50)

## 2021-06-13 LAB — HEMOGLOBIN A1C: Hgb A1c MFr Bld: 5.2 % (ref 4.6–6.5)

## 2021-06-13 MED ORDER — ROSUVASTATIN CALCIUM 5 MG PO TABS: 5.0000 mg | ORAL_TABLET | Freq: Every day | ORAL | 3 refills | Status: DC

## 2021-06-13 MED ORDER — TRAZODONE HCL 50 MG PO TABS: 50.0000 mg | ORAL_TABLET | Freq: Every evening | ORAL | 0 refills | Status: DC | PRN

## 2021-06-13 MED ORDER — SILDENAFIL CITRATE 20 MG PO TABS: 20.0000 mg | ORAL_TABLET | Freq: Every day | ORAL | 1 refills | Status: DC | PRN

## 2021-06-13 NOTE — Progress Notes (Signed)
Subjective:    Patient ID: Richard Mathews, male    DOB: 1976/08/10, 45 y.o.   MRN: [id number]  HPI Patient presents for yearly preventative medicine examination. He is a pleasant 45 year old male who  has a past medical history of High cholesterol.  Hyperlipidemia - takes crestor 5 mg. Denies myalgia or fatigue  Lab Results  Component Value Date   CHOL 230 (H) 05/07/2020   HDL 47.90 05/07/2020   LDLCALC 150 (H) 05/07/2020   TRIG 159.0 (H) 05/07/2020   CHOLHDL 5 05/07/2020   ED - takes Viagra PRN   Sleep Disturbance - a few times a week he will have difficulty falling asleep at night. Has used ambien in the past.    All immunizations and health maintenance protocols were reviewed with the patient and needed orders were placed.  Appropriate screening laboratory values were ordered for the patient including screening of hyperlipidemia, renal function and hepatic function.  Medication reconciliation,  past medical history, social history, problem list and allergies were reviewed in detail with the patient  Goals were established with regard to weight loss, exercise, and  diet in compliance with medications  Wt Readings from Last 3 Encounters:  06/13/21 207 lb (93.9 kg)  10/14/20 201 lb (91.2 kg)  05/07/20 206 lb (93.4 kg)   Review of Systems  Constitutional: Negative.   HENT: Negative.    Eyes: Negative.   Respiratory: Negative.    Cardiovascular: Negative.   Gastrointestinal: Negative.   Endocrine: Negative.   Genitourinary: Negative.   Musculoskeletal: Negative.   Skin: Negative.   Allergic/Immunologic: Negative.   Neurological: Negative.   Hematological: Negative.   Psychiatric/Behavioral:  Positive for sleep disturbance.   All other systems reviewed and are negative.   Past Medical History:  Diagnosis Date   High cholesterol     Social History   Socioeconomic History   Marital status: Married    Spouse name: [name]   Number of children: Not on file    Years of education: Not on file   Highest education level: Not on file  Occupational History   Not on file  Tobacco Use   Smoking status: Never   Smokeless tobacco: Never  Vaping Use   Vaping Use: Never used  Substance and Sexual Activity   Alcohol use: Yes    Alcohol/week: 0.0 standard drinks    Comment: socailly   Drug use: No   Sexual activity: Not on file  Other Topics Concern   Not on file  Social History Narrative   Wife Nurse, children's) is a patient with MS   Social Determinants of Health   Financial Resource Strain: Not on file  Food Insecurity: Not on file  Transportation Needs: Not on file  Physical Activity: Not on file  Stress: Not on file  Social Connections: Not on file  Intimate Partner Violence: Not on file    Past Surgical History:  Procedure Laterality Date   EAR MEATOPLASTY WITH FULL THICKNESS SKIN GRAFT     TYMPANOPLASTY  2006   VASECTOMY  2016    Family History  Problem Relation Age of Onset   Heart disease Maternal Grandfather    Heart attack Paternal Grandfather     No Known Allergies  Current Outpatient Medications on File Prior to Visit  Medication Sig Dispense Refill   rosuvastatin (CRESTOR) 5 MG tablet Take 1 tablet (5 mg total) by mouth at bedtime. 90 tablet 3   No current facility-administered medications on file prior to  visit.    BP 110/88   Pulse 65   Temp 98.6 F (37 C) (Oral)   Ht 5\' 10"  (1.778 m)   Wt 207 lb (93.9 kg)   SpO2 97%   BMI 29.70 kg/m       Objective:   Physical Exam Vitals and nursing note reviewed.  Constitutional:      General: He is not in acute distress.    Appearance: Normal appearance. He is well-developed and normal weight.  HENT:     Head: Normocephalic and atraumatic.     Right Ear: Tympanic membrane, ear canal and external ear normal. There is no impacted cerumen.     Left Ear: Tympanic membrane, ear canal and external ear normal. There is no impacted cerumen.     Nose: Nose normal. No congestion  or rhinorrhea.     Mouth/Throat:     Mouth: Mucous membranes are moist.     Pharynx: Oropharynx is clear. No oropharyngeal exudate or posterior oropharyngeal erythema.  Eyes:     General:        Right eye: No discharge.        Left eye: No discharge.     Extraocular Movements: Extraocular movements intact.     Conjunctiva/sclera: Conjunctivae normal.     Pupils: Pupils are equal, round, and reactive to light.  Neck:     Vascular: No carotid bruit.     Trachea: No tracheal deviation.  Cardiovascular:     Rate and Rhythm: Normal rate and regular rhythm.     Pulses: Normal pulses.     Heart sounds: Normal heart sounds. No murmur heard.   No friction rub. No gallop.  Pulmonary:     Effort: Pulmonary effort is normal. No respiratory distress.     Breath sounds: Normal breath sounds. No stridor. No wheezing, rhonchi or rales.  Chest:     Chest wall: No tenderness.  Abdominal:     General: Bowel sounds are normal. There is no distension.     Palpations: Abdomen is soft. There is no mass.     Tenderness: There is no abdominal tenderness. There is no right CVA tenderness, left CVA tenderness, guarding or rebound.     Hernia: No hernia is present.  Musculoskeletal:        General: No swelling, tenderness, deformity or signs of injury. Normal range of motion.     Right lower leg: No edema.     Left lower leg: No edema.  Lymphadenopathy:     Cervical: No cervical adenopathy.  Skin:    General: Skin is warm and dry.     Capillary Refill: Capillary refill takes less than 2 seconds.     Coloration: Skin is not jaundiced or pale.     Findings: No bruising, erythema, lesion or rash.  Neurological:     General: No focal deficit present.     Mental Status: He is alert and oriented to person, place, and time.     Cranial Nerves: No cranial nerve deficit.     Sensory: No sensory deficit.     Motor: No weakness.     Coordination: Coordination normal.     Gait: Gait normal.     Deep Tendon  Reflexes: Reflexes normal.  Psychiatric:        Mood and Affect: Mood normal.        Behavior: Behavior normal.        Thought Content: Thought content normal.  Judgment: Judgment normal.       Assessment & Plan:   1. Routine general medical examination at a health care facility - Follow up in one year or sooner if needed - CBC with Differential/Platelet; Future - Comprehensive metabolic panel; Future - Hemoglobin A1c; Future - Lipid panel; Future - TSH; Future  2. Mixed hyperlipidemia - Consider increase in statin  - CBC with Differential/Platelet; Future - Comprehensive metabolic panel; Future - Hemoglobin A1c; Future - Lipid panel; Future - TSH; Future  3. Paradoxical insomnia  - traZODone (DESYREL) 50 MG tablet; Take 1 tablet (50 mg total) by mouth at bedtime as needed for sleep.  Dispense: 90 tablet; Refill: 0  4. Erectile dysfunction, unspecified erectile dysfunction type  - sildenafil (REVATIO) 20 MG tablet; Take 1 tablet (20 mg total) by mouth daily as needed.  Dispense: 10 tablet; Refill: 1   Shirline Frees, NP

## 2021-06-13 NOTE — Addendum Note (Signed)
Addended by: Kandra Nicolas on: 06/13/2021 02:06 PM   Modules accepted: Orders

## 2021-06-13 NOTE — Patient Instructions (Addendum)
It was great seeing you today   I have sent in some Trazadone to help with sleep   We will follow up with you regarding your blood work

## 2021-09-07 ENCOUNTER — Other Ambulatory Visit: Payer: Self-pay | Admitting: Adult Health

## 2021-09-07 DIAGNOSIS — F5103 Paradoxical insomnia: Secondary | ICD-10-CM

## 2021-12-12 ENCOUNTER — Other Ambulatory Visit: Payer: Self-pay | Admitting: Adult Health

## 2021-12-12 DIAGNOSIS — F5103 Paradoxical insomnia: Secondary | ICD-10-CM

## 2022-01-08 ENCOUNTER — Other Ambulatory Visit: Payer: Self-pay | Admitting: Adult Health

## 2022-01-08 DIAGNOSIS — N529 Male erectile dysfunction, unspecified: Secondary | ICD-10-CM

## 2022-03-12 ENCOUNTER — Other Ambulatory Visit: Payer: Self-pay | Admitting: Adult Health

## 2022-03-12 DIAGNOSIS — F5103 Paradoxical insomnia: Secondary | ICD-10-CM

## 2022-06-04 ENCOUNTER — Encounter: Payer: Self-pay | Admitting: Adult Health

## 2022-06-04 ENCOUNTER — Ambulatory Visit: Payer: 59 | Admitting: Adult Health

## 2022-06-04 VITALS — BP 110/70 | HR 67 | Temp 97.8°F | Wt 203.7 lb

## 2022-06-04 DIAGNOSIS — Z0001 Encounter for general adult medical examination with abnormal findings: Secondary | ICD-10-CM

## 2022-06-04 DIAGNOSIS — E782 Mixed hyperlipidemia: Secondary | ICD-10-CM | POA: Diagnosis not present

## 2022-06-04 DIAGNOSIS — L237 Allergic contact dermatitis due to plants, except food: Secondary | ICD-10-CM | POA: Diagnosis not present

## 2022-06-04 DIAGNOSIS — Z0000 Encounter for general adult medical examination without abnormal findings: Secondary | ICD-10-CM

## 2022-06-04 LAB — CBC WITH DIFFERENTIAL/PLATELET
Basophils Absolute: 0 10*3/uL (ref 0.0–0.1)
Basophils Relative: 0.8 % (ref 0.0–3.0)
Eosinophils Absolute: 0.3 10*3/uL (ref 0.0–0.7)
Eosinophils Relative: 7.3 % — ABNORMAL HIGH (ref 0.0–5.0)
HCT: 46.6 % (ref 39.0–52.0)
Hemoglobin: 15.9 g/dL (ref 13.0–17.0)
Lymphocytes Relative: 30.5 % (ref 12.0–46.0)
Lymphs Abs: 1.4 10*3/uL (ref 0.7–4.0)
MCHC: 34.1 g/dL (ref 30.0–36.0)
MCV: 97.5 fl (ref 78.0–100.0)
Monocytes Absolute: 0.5 10*3/uL (ref 0.1–1.0)
Monocytes Relative: 10.4 % (ref 3.0–12.0)
Neutro Abs: 2.4 10*3/uL (ref 1.4–7.7)
Neutrophils Relative %: 51 % (ref 43.0–77.0)
Platelets: 155 10*3/uL (ref 150.0–400.0)
RBC: 4.78 Mil/uL (ref 4.22–5.81)
RDW: 12.7 % (ref 11.5–15.5)
WBC: 4.6 10*3/uL (ref 4.0–10.5)

## 2022-06-04 LAB — LIPID PANEL
Cholesterol: 147 mg/dL (ref 0–200)
HDL: 46.9 mg/dL (ref >39.00)
LDL Cholesterol: 70 mg/dL (ref 0–99)
NonHDL: 100.23
Total CHOL/HDL Ratio: 3
Triglycerides: 151 mg/dL — ABNORMAL HIGH (ref 0.0–149.0)
VLDL: 30.2 mg/dL (ref 0.0–40.0)

## 2022-06-04 LAB — COMPREHENSIVE METABOLIC PANEL
ALT: 47 U/L (ref 0–53)
AST: 27 U/L (ref 0–37)
Albumin: 4.7 g/dL (ref 3.5–5.2)
Alkaline Phosphatase: 51 U/L (ref 39–117)
BUN: 14 mg/dL (ref 6–23)
CO2: 28 mEq/L (ref 19–32)
Calcium: 9.7 mg/dL (ref 8.4–10.5)
Chloride: 104 mEq/L (ref 96–112)
Creatinine, Ser: 1.18 mg/dL (ref 0.40–1.50)
GFR: 75.12 mL/min (ref >60.00)
Glucose, Bld: 90 mg/dL (ref 70–99)
Potassium: 4.3 mEq/L (ref 3.5–5.1)
Sodium: 139 mEq/L (ref 135–145)
Total Bilirubin: 0.6 mg/dL (ref 0.2–1.2)
Total Protein: 7.3 g/dL (ref 6.0–8.3)

## 2022-06-04 LAB — TSH: TSH: 3.96 u[IU]/mL (ref 0.35–5.50)

## 2022-06-04 MED ORDER — METHYLPREDNISOLONE ACETATE 80 MG/ML IJ SUSP
80.0000 mg | Freq: Once | INTRAMUSCULAR | Status: AC
  Administered 2022-06-04: 80 mg via INTRAMUSCULAR

## 2022-06-04 MED ORDER — METHYLPREDNISOLONE ACETATE 40 MG/ML IJ SUSP
40.0000 mg | Freq: Once | INTRAMUSCULAR | Status: AC
  Administered 2022-06-04: 40 mg via INTRAMUSCULAR

## 2022-06-04 MED ORDER — HYDROXYZINE PAMOATE 25 MG PO CAPS: 25.0000 mg | ORAL_CAPSULE | Freq: Three times a day (TID) | ORAL | 0 refills | Status: DC | PRN

## 2022-06-04 MED ORDER — PREDNISONE 10 MG PO TABS: 10.0000 mg | ORAL_TABLET | Freq: Every day | ORAL | 0 refills | Status: DC

## 2022-06-04 NOTE — Patient Instructions (Addendum)
It was great seeing you today   I have sent in a prescription for oral steroids - take this daily for 10 days - start tomorrow.   Atarax is a medication called itching that I sent in - this may make you sleepy   We will follow up with you regarding your blood work

## 2022-06-04 NOTE — Progress Notes (Signed)
Subjective:    Patient ID: Richard Mathews, male    DOB: January 21, 1976, 46 y.o.   MRN: [id number]  HPI Patient presents for yearly preventative medicine examination. He is a pleasant 46 year old male who  has a past medical history of High cholesterol.  Hyperlipemia - is managed with crestor 5 mg daily He denies myalgia or fatigue  Lab Results  Component Value Date   CHOL 155 06/13/2021   HDL 48.60 06/13/2021   LDLCALC 90 06/13/2021   TRIG 81.0 06/13/2021   CHOLHDL 3 06/13/2021   ED - uses viagra PRN    Poison Ivy Dermatitis - reports that about 10-14 days ago he was working outside and became enangled in poison ivy. This week he started developing a rash on various parts of his body. He has a vesicular rash around his left eye, around his waist, on his left buttocks/thigh, and left leg. Rash is itchy. He has been using hydrocortisone cream without improvement. He denies blurred vision or eye pain.   All immunizations and health maintenance protocols were reviewed with the patient and needed orders were placed.  Appropriate screening laboratory values were ordered for the patient including screening of hyperlipidemia, renal function and hepatic function. If indicated by BPH, a PSA was ordered.  Medication reconciliation,  past medical history, social history, problem list and allergies were reviewed in detail with the patient  Goals were established with regard to weight loss, exercise, and  diet in compliance with medications. He has started going to the gym 2-3 times a week and is eating healthier.   Review of Systems  Constitutional: Negative.   HENT: Negative.    Eyes: Negative.   Respiratory: Negative.    Cardiovascular: Negative.   Gastrointestinal: Negative.   Endocrine: Negative.   Genitourinary: Negative.   Musculoskeletal: Negative.   Skin: Negative.   Allergic/Immunologic: Negative.   Neurological: Negative.   Hematological: Negative.   Psychiatric/Behavioral:  Negative.    All other systems reviewed and are negative.    Past Medical History:  Diagnosis Date   High cholesterol     Social History   Socioeconomic History   Marital status: Married    Spouse name: [name]   Number of children: Not on file   Years of education: Not on file   Highest education level: Not on file  Occupational History   Not on file  Tobacco Use   Smoking status: Never   Smokeless tobacco: Never  Vaping Use   Vaping Use: Never used  Substance and Sexual Activity   Alcohol use: Yes    Alcohol/week: 0.0 standard drinks of alcohol    Comment: socailly   Drug use: No   Sexual activity: Not on file  Other Topics Concern   Not on file  Social History Narrative   Wife Nurse, children's) is a patient with MS   Social Determinants of Health   Financial Resource Strain: Not on file  Food Insecurity: Not on file  Transportation Needs: Not on file  Physical Activity: Not on file  Stress: Not on file  Social Connections: Not on file  Intimate Partner Violence: Not on file    Past Surgical History:  Procedure Laterality Date   EAR MEATOPLASTY WITH FULL THICKNESS SKIN GRAFT     TYMPANOPLASTY  2006   VASECTOMY  2016    Family History  Problem Relation Age of Onset   Heart disease Maternal Grandfather    Heart attack Paternal Grandfather  No Known Allergies  Current Outpatient Medications on File Prior to Visit  Medication Sig Dispense Refill   rosuvastatin (CRESTOR) 5 MG tablet Take 1 tablet (5 mg total) by mouth at bedtime. 90 tablet 3   sildenafil (REVATIO) 20 MG tablet TAKE 1 TABLET(20 MG) BY MOUTH DAILY AS NEEDED 10 tablet 1   No current facility-administered medications on file prior to visit.    BP 110/70 (BP Location: Left Arm, Patient Position: Sitting, Cuff Size: Normal)   Pulse 67   Temp 97.8 F (36.6 C) (Oral)   Wt 203 lb 11.2 oz (92.4 kg)   SpO2 97%   BMI 29.23 kg/m       Objective:   Physical Exam Vitals and nursing note reviewed.   Constitutional:      General: He is not in acute distress.    Appearance: Normal appearance. He is well-developed and normal weight.  HENT:     Head: Normocephalic and atraumatic.     Right Ear: Tympanic membrane, ear canal and external ear normal. There is no impacted cerumen.     Left Ear: Tympanic membrane, ear canal and external ear normal. There is no impacted cerumen.     Nose: Nose normal. No congestion or rhinorrhea.     Mouth/Throat:     Mouth: Mucous membranes are moist.     Pharynx: Oropharynx is clear. No oropharyngeal exudate or posterior oropharyngeal erythema.  Eyes:     General:        Right eye: No discharge.        Left eye: No discharge.     Extraocular Movements: Extraocular movements intact.     Conjunctiva/sclera: Conjunctivae normal.     Pupils: Pupils are equal, round, and reactive to light.  Neck:     Vascular: No carotid bruit.     Trachea: No tracheal deviation.  Cardiovascular:     Rate and Rhythm: Normal rate and regular rhythm.     Pulses: Normal pulses.     Heart sounds: Normal heart sounds. No murmur heard.    No friction rub. No gallop.  Pulmonary:     Effort: Pulmonary effort is normal. No respiratory distress.     Breath sounds: Normal breath sounds. No stridor. No wheezing, rhonchi or rales.  Chest:     Chest wall: No tenderness.  Abdominal:     General: Bowel sounds are normal. There is no distension.     Palpations: Abdomen is soft. There is no mass.     Tenderness: There is no abdominal tenderness. There is no right CVA tenderness, left CVA tenderness, guarding or rebound.     Hernia: No hernia is present.  Musculoskeletal:        General: No swelling, tenderness, deformity or signs of injury. Normal range of motion.     Right lower leg: No edema.     Left lower leg: No edema.  Lymphadenopathy:     Cervical: No cervical adenopathy.  Skin:    General: Skin is warm and dry.     Capillary Refill: Capillary refill takes less than 2  seconds.     Coloration: Skin is not jaundiced or pale.     Findings: Erythema and rash present. No bruising or lesion. Rash is vesicular. Rash is not crusting.       Neurological:     General: No focal deficit present.     Mental Status: He is alert and oriented to person, place, and time.  Cranial Nerves: No cranial nerve deficit.     Sensory: No sensory deficit.     Motor: No weakness.     Coordination: Coordination normal.     Gait: Gait normal.     Deep Tendon Reflexes: Reflexes normal.  Psychiatric:        Mood and Affect: Mood normal.        Behavior: Behavior normal.        Thought Content: Thought content normal.        Judgment: Judgment normal.        Assessment & Plan:  1. Routine general medical examination at a health care facility - Continue to exericse and eat healthy - CBC with Differential/Platelet; Future - Comprehensive metabolic panel; Future - Lipid panel; Future - TSH; Future  2. Poison ivy dermatitis - Depo medrol injection given in the office and will have him start oral prednisone tomorrow.   - predniSONE (DELTASONE) 10 MG tablet; Take 1 tablet (10 mg total) by mouth daily with breakfast.  Dispense: 10 tablet; Refill: 0 - methylPREDNISolone acetate (DEPO-MEDROL) injection 80 mg - methylPREDNISolone acetate (DEPO-MEDROL) injection 40 mg  3. Mixed hyperlipidemia - Consider increase in statin - CBC with Differential/Platelet; Future - Comprehensive metabolic panel; Future - Lipid panel; Future - TSH; Future  Shirline Frees, NP

## 2022-06-10 ENCOUNTER — Other Ambulatory Visit: Payer: Self-pay | Admitting: Adult Health

## 2022-06-11 ENCOUNTER — Encounter: Payer: Self-pay | Admitting: Adult Health

## 2023-05-22 ENCOUNTER — Other Ambulatory Visit: Payer: Self-pay | Admitting: Adult Health

## 2023-05-26 NOTE — Telephone Encounter (Signed)
Patient need to schedule a CPE for more refills. 

## 2023-07-01 NOTE — Progress Notes (Signed)
Subjective:    Patient ID: Richard Mathews, male    DOB: [birth year], 47 y.o.   MRN: [id number]  HPI Patient presents for yearly preventative medicine examination. He is a pleasant 47 year old male who  has a past medical history of High cholesterol.  Hyperlipemia - is managed with crestor 5 mg daily He denies myalgia or fatigue  Lab Results  Component Value Date   CHOL 147 06/04/2022   HDL 46.90 06/04/2022   LDLCALC 70 06/04/2022   TRIG 151.0 (H) 06/04/2022   CHOLHDL 3 06/04/2022    ED - uses viagra PRN   All immunizations and health maintenance protocols were reviewed with the patient and needed orders were placed.  Appropriate screening laboratory values were ordered for the patient including screening of hyperlipidemia, renal function and hepatic function.   Medication reconciliation,  past medical history, social history, problem list and allergies were reviewed in detail with the patient  Goals were established with regard to weight loss, exercise, and  diet in compliance with medications. He does not eat healthy and his exercise has been lacking.   Wt Readings from Last 3 Encounters:  07/02/23 213 lb (96.6 kg)  06/04/22 203 lb 11.2 oz (92.4 kg)  06/13/21 207 lb (93.9 kg)   He is due for colon cancer screening - he has no family history of colon cancer   Review of Systems  Constitutional: Negative.   HENT: Negative.    Eyes: Negative.   Respiratory: Negative.    Cardiovascular: Negative.   Gastrointestinal: Negative.   Endocrine: Negative.   Genitourinary: Negative.   Musculoskeletal: Negative.   Skin: Negative.   Allergic/Immunologic: Negative.   Neurological: Negative.   Hematological: Negative.   Psychiatric/Behavioral: Negative.    All other systems reviewed and are negative.  Past Medical History:  Diagnosis Date   High cholesterol     Social History   Socioeconomic History   Marital status: Married    Spouse name: [name]   Number of children: Not  on file   Years of education: Not on file   Highest education level: Not on file  Occupational History   Not on file  Tobacco Use   Smoking status: Never   Smokeless tobacco: Never  Vaping Use   Vaping status: Never Used  Substance and Sexual Activity   Alcohol use: Yes    Alcohol/week: 0.0 standard drinks of alcohol    Comment: socailly   Drug use: No   Sexual activity: Not on file  Other Topics Concern   Not on file  Social History Narrative   Wife Nurse, children's) is a patient with MS   Social Determinants of Health   Financial Resource Strain: Not on file  Food Insecurity: Not on file  Transportation Needs: Not on file  Physical Activity: Not on file  Stress: Not on file  Social Connections: Not on file  Intimate Partner Violence: Not on file    Past Surgical History:  Procedure Laterality Date   EAR MEATOPLASTY WITH FULL THICKNESS SKIN GRAFT     TYMPANOPLASTY  2006   VASECTOMY  2016    Family History  Problem Relation Age of Onset   Heart disease Maternal Grandfather    Heart attack Paternal Grandfather     No Known Allergies  Current Outpatient Medications on File Prior to Visit  Medication Sig Dispense Refill   hydrOXYzine (VISTARIL) 25 MG capsule Take 1 capsule (25 mg total) by mouth every 8 (eight) hours as needed.  15 capsule 0   rosuvastatin (CRESTOR) 5 MG tablet TAKE 1 TABLET(5 MG) BY MOUTH AT BEDTIME 90 tablet 3   sildenafil (REVATIO) 20 MG tablet TAKE 1 TABLET(20 MG) BY MOUTH DAILY AS NEEDED 10 tablet 1   No current facility-administered medications on file prior to visit.    BP 120/82   Pulse 75   Temp 98.4 F (36.9 C) (Oral)   Ht 5\' 10"  (1.778 m)   Wt 213 lb (96.6 kg)   SpO2 97%   BMI 30.56 kg/m       Objective:   Physical Exam Vitals and nursing note reviewed.  Constitutional:      General: He is not in acute distress.    Appearance: Normal appearance. He is obese. He is not ill-appearing.  HENT:     Head: Normocephalic and atraumatic.      Right Ear: Tympanic membrane, ear canal and external ear normal. There is no impacted cerumen.     Left Ear: Tympanic membrane, ear canal and external ear normal. There is no impacted cerumen.     Nose: Nose normal. No congestion or rhinorrhea.     Mouth/Throat:     Mouth: Mucous membranes are moist.     Pharynx: Oropharynx is clear.  Eyes:     Extraocular Movements: Extraocular movements intact.     Conjunctiva/sclera: Conjunctivae normal.     Pupils: Pupils are equal, round, and reactive to light.  Neck:     Vascular: No carotid bruit.  Cardiovascular:     Rate and Rhythm: Normal rate and regular rhythm.     Pulses: Normal pulses.     Heart sounds: No murmur heard.    No friction rub. No gallop.  Pulmonary:     Effort: Pulmonary effort is normal.     Breath sounds: Normal breath sounds.  Abdominal:     General: Abdomen is flat. Bowel sounds are normal. There is no distension.     Palpations: Abdomen is soft. There is no mass.     Tenderness: There is no abdominal tenderness. There is no guarding or rebound.     Hernia: No hernia is present.  Musculoskeletal:        General: Normal range of motion.     Cervical back: Normal range of motion and neck supple.  Lymphadenopathy:     Cervical: No cervical adenopathy.  Skin:    General: Skin is warm and dry.     Capillary Refill: Capillary refill takes less than 2 seconds.  Neurological:     General: No focal deficit present.     Mental Status: He is alert and oriented to person, place, and time.  Psychiatric:        Mood and Affect: Mood normal.        Behavior: Behavior normal.        Thought Content: Thought content normal.        Judgment: Judgment normal.       Assessment & Plan:  1. Routine general medical examination at a health care facility Today patient counseled on age appropriate routine health concerns for screening and prevention, each reviewed and up to date or declined. Immunizations reviewed and up to  date or declined. Labs ordered and reviewed. Risk factors for depression reviewed and negative. Hearing function and visual acuity are intact. ADLs screened and addressed as needed. Functional ability and level of safety reviewed and appropriate. Education, counseling and referrals performed based on assessed risks today. Patient provided with  a copy of personalized plan for preventive services. - Follow up in one year  - Work on weight loss through diet and exercise   2. Mixed hyperlipidemia - Continue with statin. Consider increase dose  - Lipid panel; Future - TSH; Future - CBC; Future - Comprehensive metabolic panel; Future  3. Erectile dysfunction, unspecified erectile dysfunction type  - Lipid panel; Future - TSH; Future - CBC; Future - Comprehensive metabolic panel; Future - sildenafil (REVATIO) 20 MG tablet; Take 1 tablet (20 mg total) by mouth 3 (three) times daily.  Dispense: 10 tablet; Refill: 3  4. Need for hepatitis C screening test  - Hep C Antibody; Future  5. Encounter for screening for HIV  - HIV Antibody (routine testing w rflx); Future  6. Colon cancer screening  - Cologuard  7. Prostate cancer screening  - PSA; Future  8. Class 1 obesity - weight is up 13 pounds this year - Encouraged healthy diet and exercise - Lipid panel; Future - TSH; Future - CBC; Future - Comprehensive metabolic panel; Future  Shirline Frees, NP

## 2023-07-02 ENCOUNTER — Ambulatory Visit (INDEPENDENT_AMBULATORY_CARE_PROVIDER_SITE_OTHER): Payer: BC Managed Care – PPO | Admitting: Adult Health

## 2023-07-02 ENCOUNTER — Encounter: Payer: Self-pay | Admitting: Adult Health

## 2023-07-02 VITALS — BP 120/82 | HR 75 | Temp 98.4°F | Ht 70.0 in | Wt 213.0 lb

## 2023-07-02 DIAGNOSIS — E669 Obesity, unspecified: Secondary | ICD-10-CM | POA: Diagnosis not present

## 2023-07-02 DIAGNOSIS — E782 Mixed hyperlipidemia: Secondary | ICD-10-CM | POA: Diagnosis not present

## 2023-07-02 DIAGNOSIS — Z1159 Encounter for screening for other viral diseases: Secondary | ICD-10-CM | POA: Diagnosis not present

## 2023-07-02 DIAGNOSIS — Z114 Encounter for screening for human immunodeficiency virus [HIV]: Secondary | ICD-10-CM

## 2023-07-02 DIAGNOSIS — Z0000 Encounter for general adult medical examination without abnormal findings: Secondary | ICD-10-CM | POA: Diagnosis not present

## 2023-07-02 DIAGNOSIS — N529 Male erectile dysfunction, unspecified: Secondary | ICD-10-CM | POA: Diagnosis not present

## 2023-07-02 DIAGNOSIS — Z125 Encounter for screening for malignant neoplasm of prostate: Secondary | ICD-10-CM

## 2023-07-02 DIAGNOSIS — Z1211 Encounter for screening for malignant neoplasm of colon: Secondary | ICD-10-CM

## 2023-07-02 LAB — COMPREHENSIVE METABOLIC PANEL
ALT: 53 U/L (ref 0–53)
AST: 24 U/L (ref 0–37)
Albumin: 4.9 g/dL (ref 3.5–5.2)
Alkaline Phosphatase: 51 U/L (ref 39–117)
BUN: 17 mg/dL (ref 6–23)
CO2: 27 mEq/L (ref 19–32)
Calcium: 9.6 mg/dL (ref 8.4–10.5)
Chloride: 103 mEq/L (ref 96–112)
Creatinine, Ser: 1 mg/dL (ref 0.40–1.50)
GFR: 90.93 mL/min (ref >60.00)
Glucose, Bld: 98 mg/dL (ref 70–99)
Potassium: 4.5 mEq/L (ref 3.5–5.1)
Sodium: 139 mEq/L (ref 135–145)
Total Bilirubin: 0.7 mg/dL (ref 0.2–1.2)
Total Protein: 7.1 g/dL (ref 6.0–8.3)

## 2023-07-02 LAB — LIPID PANEL
Cholesterol: 187 mg/dL (ref 0–200)
HDL: 54.4 mg/dL (ref >39.00)
LDL Cholesterol: 114 mg/dL — ABNORMAL HIGH (ref 0–99)
NonHDL: 133.05
Total CHOL/HDL Ratio: 3
Triglycerides: 95 mg/dL (ref 0.0–149.0)
VLDL: 19 mg/dL (ref 0.0–40.0)

## 2023-07-02 LAB — CBC
HCT: 47.2 % (ref 39.0–52.0)
Hemoglobin: 15.6 g/dL (ref 13.0–17.0)
MCHC: 33 g/dL (ref 30.0–36.0)
MCV: 99.7 fl (ref 78.0–100.0)
Platelets: 164 10*3/uL (ref 150.0–400.0)
RBC: 4.73 Mil/uL (ref 4.22–5.81)
RDW: 13 % (ref 11.5–15.5)
WBC: 5.3 10*3/uL (ref 4.0–10.5)

## 2023-07-02 LAB — PSA: PSA: 1.13 ng/mL (ref 0.10–4.00)

## 2023-07-02 LAB — TSH: TSH: 3.98 u[IU]/mL (ref 0.35–5.50)

## 2023-07-02 MED ORDER — SILDENAFIL CITRATE 20 MG PO TABS
20.0000 mg | ORAL_TABLET | Freq: Three times a day (TID) | ORAL | 3 refills | Status: DC
Start: 2023-07-02 — End: 2024-10-06

## 2023-07-02 MED ORDER — ROSUVASTATIN CALCIUM 5 MG PO TABS: 5.0000 mg | ORAL_TABLET | Freq: Every day | ORAL | 3 refills | Status: DC

## 2023-07-02 NOTE — Patient Instructions (Signed)
It was great seeing you today   We will follow up with you regarding your lab work   Please let me know if you need anything   

## 2023-07-25 DIAGNOSIS — Z1211 Encounter for screening for malignant neoplasm of colon: Secondary | ICD-10-CM | POA: Diagnosis not present

## 2023-08-03 ENCOUNTER — Other Ambulatory Visit: Payer: Self-pay | Admitting: Adult Health

## 2023-08-03 DIAGNOSIS — R195 Other fecal abnormalities: Secondary | ICD-10-CM

## 2023-08-16 ENCOUNTER — Encounter: Payer: Self-pay | Admitting: Gastroenterology

## 2023-08-16 ENCOUNTER — Ambulatory Visit (AMBULATORY_SURGERY_CENTER): Payer: BC Managed Care – PPO

## 2023-08-16 VITALS — Ht 70.0 in | Wt 210.0 lb

## 2023-08-16 DIAGNOSIS — R195 Other fecal abnormalities: Secondary | ICD-10-CM

## 2023-08-16 DIAGNOSIS — Z1211 Encounter for screening for malignant neoplasm of colon: Secondary | ICD-10-CM

## 2023-08-16 MED ORDER — NA SULFATE-K SULFATE-MG SULF 17.5-3.13-1.6 GM/177ML PO SOLN
1.0000 | Freq: Once | ORAL | 0 refills | Status: AC
Start: 2023-08-16 — End: 2023-08-16

## 2023-08-16 NOTE — Progress Notes (Signed)

## 2023-08-26 ENCOUNTER — Encounter: Payer: Self-pay | Admitting: Certified Registered Nurse Anesthetist

## 2023-08-27 ENCOUNTER — Encounter: Payer: Self-pay | Admitting: Gastroenterology

## 2023-08-27 ENCOUNTER — Ambulatory Visit (AMBULATORY_SURGERY_CENTER): Payer: BC Managed Care – PPO | Admitting: Gastroenterology

## 2023-08-27 VITALS — BP 118/69 | HR 57 | Temp 98.3°F | Resp 13 | Ht 70.0 in | Wt 210.0 lb

## 2023-08-27 DIAGNOSIS — D124 Benign neoplasm of descending colon: Secondary | ICD-10-CM

## 2023-08-27 DIAGNOSIS — Z1211 Encounter for screening for malignant neoplasm of colon: Secondary | ICD-10-CM

## 2023-08-27 DIAGNOSIS — D125 Benign neoplasm of sigmoid colon: Secondary | ICD-10-CM | POA: Diagnosis not present

## 2023-08-27 DIAGNOSIS — D122 Benign neoplasm of ascending colon: Secondary | ICD-10-CM | POA: Diagnosis not present

## 2023-08-27 DIAGNOSIS — D123 Benign neoplasm of transverse colon: Secondary | ICD-10-CM | POA: Diagnosis not present

## 2023-08-27 DIAGNOSIS — K635 Polyp of colon: Secondary | ICD-10-CM | POA: Diagnosis not present

## 2023-08-27 MED ORDER — SODIUM CHLORIDE 0.9 % IV SOLN: 500.0000 mL | Freq: Once | INTRAVENOUS | Status: DC

## 2023-08-27 NOTE — Progress Notes (Signed)
Called to room to assist during endoscopic procedure.  Patient ID and intended procedure confirmed with present staff. Received instructions for my participation in the procedure from the performing physician.  

## 2023-08-27 NOTE — Progress Notes (Signed)
Report given to PACU, vss 

## 2023-08-27 NOTE — Op Note (Addendum)
[facility] Patient Name: Richard Mathews Procedure Date: 08/27/2023 1:35 PM MRN: [id number] Endoscopist: Sherilyn Cooter L. Myrtie Neither , MD, [id number] Age: 47 Referring MD:  Date of Birth: [birth year] Gender: Male Account #: [id number] Procedure:                Colonoscopy Indications:              Screening for colorectal malignant neoplasm, This                            is the patient's first colonoscopy Medicines:                Monitored Anesthesia Care Procedure:                Pre-Anesthesia Assessment:                           - Prior to the procedure, a History and Physical                            was performed, and patient medications and                            allergies were reviewed. The patient's tolerance of                            previous anesthesia was also reviewed. The risks                            and benefits of the procedure and the sedation                            options and risks were discussed with the patient.                            All questions were answered, and informed consent                            was obtained. Prior Anticoagulants: The patient has                            taken no anticoagulant or antiplatelet agents. ASA                            Grade Assessment: II - A patient with mild systemic                            disease. After reviewing the risks and benefits,                            the patient was deemed in satisfactory condition to                            undergo the procedure.  After obtaining informed consent, the colonoscope                            was passed under direct vision. Throughout the                            procedure, the patient's blood pressure, pulse, and                            oxygen saturations were monitored continuously. The                            CF [id number] #[id number] was introduced through the anus                            and advanced to the  the cecum, identified by                            appendiceal orifice and ileocecal valve. The                            colonoscopy was performed without difficulty. The                            patient tolerated the procedure well. The quality                            of the bowel preparation was excellent. The                            ileocecal valve, appendiceal orifice, and rectum                            were photographed. Scope In: 1:45:07 PM Scope Out: 2:12:25 PM Scope Withdrawal Time: 0 hours 25 minutes 25 seconds  Total Procedure Duration: 0 hours 27 minutes 18 seconds  Findings:                 The perianal and digital rectal examinations were                            normal.                           An 8 mm polyp was found in the ascending colon. The                            polyp was mucous-capped and semi-sessile. The polyp                            was removed with a cold snare. Resection and                            retrieval were complete. (Jar 1)  A diminutive polyp was found in the transverse                            colon. The polyp was sessile. The polyp was removed                            with a cold snare. Resection and retrieval were                            complete. (Jar 1)                           A 14 mm polyp was found in the descending colon.                            The polyp was pedunculated. Area was successfully                            injected with 0.5 mL of a 0.01 mg/mL solution of                            epinephrine for hemostasis. The polyp was removed                            with a hot snare. Resection and retrieval were                            complete. Area was tattooed with an injection of 1                            mL of Spot (carbon black). - two 0.5 ml injections                            (mL just proximal to the polypectomy site - see                            photos)                            A 16 mm polyp was found in the sigmoid colon. The                            polyp was pedunculated. Area was successfully                            injected with 1 mL of a 0.01 mg/mL solution of                            epinephrine for hemostasis. The polyp was removed                            with a hot snare. Resection and retrieval were  complete. Area was tattooed with an injection of                            0.5 mL of [place] ink.                           Internal hemorrhoids were found. The hemorrhoids                            were small and Grade I (internal hemorrhoids that                            do not prolapse).                           The exam was otherwise without abnormality on                            direct and retroflexion views. Complications:            No immediate complications. Estimated Blood Loss:     Estimated blood loss was minimal. Impression:               - One 8 mm polyp in the ascending colon, removed                            with a cold snare. Resected and retrieved.                           - One diminutive polyp in the transverse colon,                            removed with a cold snare. Resected and retrieved.                           - One 14 mm polyp in the descending colon, removed                            with a hot snare. Resected and retrieved. Injected.                            Tattooed.                           - One 16 mm polyp in the sigmoid colon, removed                            with a hot snare. Resected and retrieved. Injected.                            Tattooed.                           - Internal hemorrhoids.                           -  The examination was otherwise normal on direct                            and retroflexion views. Recommendation:           - Patient has a contact number available for                            emergencies. The signs and  symptoms of potential                            delayed complications were discussed with the                            patient. Return to normal activities tomorrow.                            Written discharge instructions were provided to the                            patient.                           - Resume previous diet.                           - Continue present medications.                           - Await pathology results.                           - Repeat colonoscopy is recommended for                            surveillance. The colonoscopy date will be                            determined after pathology results from today's                            exam become available for review.                           - If you have siblings age 72 or above, please                            consider sharing this information with them so they                            can speak with their physician about getting a                            screening colonoscopy. Renuka Farfan L. Myrtie Neither, MD 08/27/2023 2:23:11 PM This report has been signed electronically.

## 2023-08-27 NOTE — Patient Instructions (Addendum)
   Handouts on polyps given to you today.   Await pathology results on polyps removed   Resume previous diet & medications     YOU HAD AN ENDOSCOPIC PROCEDURE TODAY AT THE [facility]:   Refer to the procedure report that was given to you for any specific questions about what was found during the examination.  If the procedure report does not answer your questions, please call your gastroenterologist to clarify.  If you requested that your care partner not be given the details of your procedure findings, then the procedure report has been included in a sealed envelope for you to review at your convenience later.  YOU SHOULD EXPECT: Some feelings of bloating in the abdomen. Passage of more gas than usual.  Walking can help get rid of the air that was put into your GI tract during the procedure and reduce the bloating. If you had a lower endoscopy (such as a colonoscopy or flexible sigmoidoscopy) you may notice spotting of blood in your stool or on the toilet paper. If you underwent a bowel prep for your procedure, you may not have a normal bowel movement for a few days.  Please Note:  You might notice some irritation and congestion in your nose or some drainage.  This is from the oxygen used during your procedure.  There is no need for concern and it should clear up in a day or so.  SYMPTOMS TO REPORT IMMEDIATELY:  Following lower endoscopy (colonoscopy or flexible sigmoidoscopy):  Excessive amounts of blood in the stool  Significant tenderness or worsening of abdominal pains  Swelling of the abdomen that is new, acute  Fever of 100F or higher   For urgent or emergent issues, a gastroenterologist can be reached at any hour by calling [phone]. Do not use MyChart messaging for urgent concerns.    DIET:  We do recommend a small meal at first, but then you may proceed to your regular diet.  Drink plenty of fluids but you should avoid alcoholic beverages for 24  hours.  ACTIVITY:  You should plan to take it easy for the rest of today and you should NOT DRIVE or use heavy machinery until tomorrow (because of the sedation medicines used during the test).    FOLLOW UP: Our staff will call the number listed on your records the next business day following your procedure.  We will call around 7:15- 8:00 am to check on you and address any questions or concerns that you may have regarding the information given to you following your procedure. If we do not reach you, we will leave a message.     If any biopsies were taken you will be contacted by phone or by letter within the next 1-3 weeks.  Please call us at [phone] if you have not heard about the biopsies in 3 weeks.    SIGNATURES/CONFIDENTIALITY: You and/or your care partner have signed paperwork which will be entered into your electronic medical record.  These signatures attest to the fact that that the information above on your After Visit Summary has been reviewed and is understood.  Full responsibility of the confidentiality of this discharge information lies with you and/or your care-partner.

## 2023-08-27 NOTE — Progress Notes (Signed)
History and Physical:  This patient presents for endoscopic testing for: Encounter Diagnosis  Name Primary?   Special screening for malignant neoplasms, colon Yes    Average risk for colorectal cancer.  First screening exam.  Patient denies chronic abdominal pain, rectal bleeding, constipation or diarrhea.   Patient is otherwise without complaints or active issues today.   Past Medical History: Past Medical History:  Diagnosis Date   High cholesterol      Past Surgical History: Past Surgical History:  Procedure Laterality Date   EAR MEATOPLASTY WITH FULL THICKNESS SKIN GRAFT     TYMPANOPLASTY  2006   VASECTOMY  2016    Allergies: No Known Allergies  Outpatient Meds: Current Outpatient Medications  Medication Sig Dispense Refill   rosuvastatin (CRESTOR) 5 MG tablet Take 1 tablet (5 mg total) by mouth daily. 90 tablet 3   hydrOXYzine (VISTARIL) 25 MG capsule Take 1 capsule (25 mg total) by mouth every 8 (eight) hours as needed. 15 capsule 0   sildenafil (REVATIO) 20 MG tablet Take 1 tablet (20 mg total) by mouth 3 (three) times daily. (Patient not taking: Reported on 08/16/2023) 10 tablet 3   Current Facility-Administered Medications  Medication Dose Route Frequency Provider Last Rate Last Admin   0.9 %  sodium chloride infusion  500 mL Intravenous Once Sherrilyn Rist, MD          ___________________________________________________________________ Objective   Exam:  BP 132/76   Pulse (!) 55   Temp 98.3 F (36.8 C) (Temporal)   Resp 16   Ht 5\' 10"  (1.778 m)   Wt 210 lb (95.3 kg)   SpO2 98%   BMI 30.13 kg/m   CV: regular , S1/S2 Resp: clear to auscultation bilaterally, normal RR and effort noted GI: soft, no tenderness, with active bowel sounds.   Assessment: Encounter Diagnosis  Name Primary?   Special screening for malignant neoplasms, colon Yes     Plan: Colonoscopy   The benefits and risks of the planned procedure were described in  detail with the patient or (when appropriate) their health care proxy.  Risks were outlined as including, but not limited to, bleeding, infection, perforation, adverse medication reaction leading to cardiac or pulmonary decompensation, pancreatitis (if ERCP).  The limitation of incomplete mucosal visualization was also discussed.  No guarantees or warranties were given.  The patient is appropriate for an endoscopic procedure in the ambulatory setting.   - Amada Jupiter, MD

## 2023-08-27 NOTE — Progress Notes (Signed)
Pt's states no medical or surgical changes since previsit or office visit. 

## 2023-08-30 ENCOUNTER — Telehealth: Payer: Self-pay | Admitting: *Deleted

## 2023-08-30 NOTE — Telephone Encounter (Signed)
  Follow up Call-     08/27/2023   12:50 PM  Call back number  Post procedure Call Back phone  # [phone]  Permission to leave phone message Yes     Patient questions:  Do you have a fever, pain , or abdominal swelling? No. Pain Score  0 *  Have you tolerated food without any problems? Yes.    Have you been able to return to your normal activities? Yes.    Do you have any questions about your discharge instructions: Diet   No. Medications  No. Follow up visit  No.  Do you have questions or concerns about your Care? No.  Actions: * If pain score is 4 or above: No action needed, pain <4.

## 2023-09-01 LAB — SURGICAL PATHOLOGY

## 2023-09-02 ENCOUNTER — Encounter: Payer: Self-pay | Admitting: Gastroenterology

## 2024-09-14 ENCOUNTER — Other Ambulatory Visit: Payer: Self-pay | Admitting: Adult Health

## 2024-10-06 ENCOUNTER — Other Ambulatory Visit: Payer: Self-pay | Admitting: Adult Health

## 2024-10-06 ENCOUNTER — Ambulatory Visit: Admitting: Adult Health

## 2024-10-06 ENCOUNTER — Ambulatory Visit: Payer: Self-pay | Admitting: Adult Health

## 2024-10-06 ENCOUNTER — Encounter: Payer: Self-pay | Admitting: Adult Health

## 2024-10-06 VITALS — BP 102/80 | HR 69 | Temp 98.2°F | Ht 69.0 in | Wt 210.0 lb

## 2024-10-06 DIAGNOSIS — E782 Mixed hyperlipidemia: Secondary | ICD-10-CM

## 2024-10-06 DIAGNOSIS — N529 Male erectile dysfunction, unspecified: Secondary | ICD-10-CM | POA: Diagnosis not present

## 2024-10-06 DIAGNOSIS — Z23 Encounter for immunization: Secondary | ICD-10-CM | POA: Diagnosis not present

## 2024-10-06 DIAGNOSIS — Z0000 Encounter for general adult medical examination without abnormal findings: Secondary | ICD-10-CM | POA: Diagnosis not present

## 2024-10-06 DIAGNOSIS — R55 Syncope and collapse: Secondary | ICD-10-CM | POA: Diagnosis not present

## 2024-10-06 LAB — COMPREHENSIVE METABOLIC PANEL WITH GFR
ALT: 42 U/L (ref 0–53)
AST: 28 U/L (ref 0–37)
Albumin: 4.8 g/dL (ref 3.5–5.2)
Alkaline Phosphatase: 47 U/L (ref 39–117)
BUN: 19 mg/dL (ref 6–23)
CO2: 27 meq/L (ref 19–32)
Calcium: 9.4 mg/dL (ref 8.4–10.5)
Chloride: 103 meq/L (ref 96–112)
Creatinine, Ser: 1.14 mg/dL (ref 0.40–1.50)
GFR: 77.02 mL/min (ref >60.00)
Glucose, Bld: 88 mg/dL (ref 70–99)
Potassium: 4.3 meq/L (ref 3.5–5.1)
Sodium: 137 meq/L (ref 135–145)
Total Bilirubin: 0.7 mg/dL (ref 0.2–1.2)
Total Protein: 7.4 g/dL (ref 6.0–8.3)

## 2024-10-06 LAB — CBC
HCT: 46.1 % (ref 39.0–52.0)
Hemoglobin: 15.9 g/dL (ref 13.0–17.0)
MCHC: 34.4 g/dL (ref 30.0–36.0)
MCV: 97.9 fl (ref 78.0–100.0)
Platelets: 155 K/uL (ref 150.0–400.0)
RBC: 4.71 Mil/uL (ref 4.22–5.81)
RDW: 12.8 % (ref 11.5–15.5)
WBC: 6 K/uL (ref 4.0–10.5)

## 2024-10-06 LAB — LIPID PANEL
Cholesterol: 181 mg/dL (ref 0–200)
HDL: 54.5 mg/dL (ref >39.00)
LDL Cholesterol: 102 mg/dL — ABNORMAL HIGH (ref 0–99)
NonHDL: 126.89
Total CHOL/HDL Ratio: 3
Triglycerides: 126 mg/dL (ref 0.0–149.0)
VLDL: 25.2 mg/dL (ref 0.0–40.0)

## 2024-10-06 LAB — TSH: TSH: 4.68 u[IU]/mL (ref 0.35–5.50)

## 2024-10-06 MED ORDER — ROSUVASTATIN CALCIUM 10 MG PO TABS: 10.0000 mg | ORAL_TABLET | Freq: Every day | ORAL | 3 refills | Status: AC

## 2024-10-06 MED ORDER — SILDENAFIL CITRATE 20 MG PO TABS: 20.0000 mg | ORAL_TABLET | Freq: Every day | ORAL | 6 refills | Status: DC

## 2024-10-06 MED ORDER — SILDENAFIL CITRATE 20 MG PO TABS: 20.0000 mg | ORAL_TABLET | Freq: Every day | ORAL | 6 refills | Status: AC

## 2024-10-06 NOTE — Patient Instructions (Signed)
 It was great seeing you today   We will follow up with you regarding your lab work   Please let me know if you need anything
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
